# Patient Record
Sex: Female | Born: 2000 | Race: Asian | Hispanic: No | Marital: Single | Smoking: Never smoker
Health system: Southern US, Community
[De-identification: ages and names within clinical notes are randomized; demographics above are authoritative.]

## PROBLEM LIST (undated history)

## (undated) ENCOUNTER — Inpatient Hospital Stay (HOSPITAL_COMMUNITY): Payer: Self-pay

## (undated) DIAGNOSIS — O24419 Gestational diabetes mellitus in pregnancy, unspecified control: Secondary | ICD-10-CM

## (undated) DIAGNOSIS — I10 Essential (primary) hypertension: Secondary | ICD-10-CM

## (undated) DIAGNOSIS — Z8619 Personal history of other infectious and parasitic diseases: Secondary | ICD-10-CM

## (undated) DIAGNOSIS — R030 Elevated blood-pressure reading, without diagnosis of hypertension: Secondary | ICD-10-CM

## (undated) DIAGNOSIS — O139 Gestational [pregnancy-induced] hypertension without significant proteinuria, unspecified trimester: Secondary | ICD-10-CM

## (undated) HISTORY — DX: Personal history of other infectious and parasitic diseases: Z86.19

## (undated) HISTORY — DX: Gestational (pregnancy-induced) hypertension without significant proteinuria, unspecified trimester: O13.9

## (undated) HISTORY — PX: NO PAST SURGERIES: SHX2092

## (undated) HISTORY — DX: Elevated blood-pressure reading, without diagnosis of hypertension: R03.0

## (undated) HISTORY — DX: Essential (primary) hypertension: I10

---
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
## 2020-03-09 ENCOUNTER — Ambulatory Visit (INDEPENDENT_AMBULATORY_CARE_PROVIDER_SITE_OTHER): Payer: Medicaid Other | Admitting: Primary Care

## 2020-07-01 ENCOUNTER — Encounter (INDEPENDENT_AMBULATORY_CARE_PROVIDER_SITE_OTHER): Payer: Self-pay | Admitting: Primary Care

## 2020-07-01 DIAGNOSIS — B379 Candidiasis, unspecified: Secondary | ICD-10-CM

## 2020-07-02 ENCOUNTER — Telehealth (INDEPENDENT_AMBULATORY_CARE_PROVIDER_SITE_OTHER): Payer: Self-pay

## 2020-07-02 NOTE — Telephone Encounter (Signed)
Copied from CRM [id number]. Topic: General - Other >> Jul 02, 2020  9:19 AM Marylen Ponto wrote: Reason for CRM: Pt requests call back from [facility]. Pt did not want to provide any details other than its about a yeast infection. Pt declined offer to schedule appt.

## 2020-07-07 NOTE — Telephone Encounter (Signed)
Called # left message regarding  having a question- no information given

## 2020-07-10 MED ORDER — MONISTAT 1 COMBO PACK 1200 & 2 MG & % VA KIT: 1.0000 | PACK | Freq: Once | VAGINAL | 0 refills | Status: AC

## 2020-10-20 ENCOUNTER — Telehealth: Payer: Self-pay | Admitting: Obstetrics and Gynecology

## 2020-10-20 ENCOUNTER — Ambulatory Visit (INDEPENDENT_AMBULATORY_CARE_PROVIDER_SITE_OTHER): Payer: Medicaid Other | Admitting: Primary Care

## 2020-10-20 ENCOUNTER — Ambulatory Visit (INDEPENDENT_AMBULATORY_CARE_PROVIDER_SITE_OTHER): Payer: Medicaid Other

## 2020-10-20 ENCOUNTER — Encounter (INDEPENDENT_AMBULATORY_CARE_PROVIDER_SITE_OTHER): Payer: Self-pay | Admitting: Primary Care

## 2020-10-20 ENCOUNTER — Other Ambulatory Visit: Payer: Self-pay

## 2020-10-20 VITALS — BP 140/95 | HR 80 | Temp 97.7°F | Ht 62.0 in | Wt 182.0 lb

## 2020-10-20 DIAGNOSIS — Z3480 Encounter for supervision of other normal pregnancy, unspecified trimester: Secondary | ICD-10-CM

## 2020-10-20 DIAGNOSIS — N912 Amenorrhea, unspecified: Secondary | ICD-10-CM | POA: Diagnosis not present

## 2020-10-20 DIAGNOSIS — O0990 Supervision of high risk pregnancy, unspecified, unspecified trimester: Secondary | ICD-10-CM | POA: Insufficient documentation

## 2020-10-20 DIAGNOSIS — I10 Essential (primary) hypertension: Secondary | ICD-10-CM | POA: Diagnosis not present

## 2020-10-20 DIAGNOSIS — Z3201 Encounter for pregnancy test, result positive: Secondary | ICD-10-CM | POA: Diagnosis not present

## 2020-10-20 DIAGNOSIS — Z0001 Encounter for general adult medical examination with abnormal findings: Secondary | ICD-10-CM

## 2020-10-20 LAB — POCT URINE PREGNANCY: Preg Test, Ur: POSITIVE — AB

## 2020-10-20 MED ORDER — NIFEDIPINE ER OSMOTIC RELEASE 30 MG PO TB24: 30.0000 mg | ORAL_TABLET | Freq: Every day | ORAL | 1 refills | Status: DC

## 2020-10-20 NOTE — Telephone Encounter (Signed)
Spoke with patient about going back to [facility] because she has a high risk pregnancy. She said she would call the office if she hasn't heard from them by Friday.

## 2020-10-20 NOTE — Patient Instructions (Signed)
    Please remember that if you are not able to keep your appointment to call the office and reschedule, or cancel. If you no-show or cancel with less than 24 hour notice we will charge you, not your insurance a $50 fee.  

## 2020-10-20 NOTE — Progress Notes (Signed)
    I discussed the limitations, risks, security and privacy concerns of performing an evaluation and management service by telephone and the availability of in person appointments. I also discussed with the patient that there may be a patient responsible charge related to this service. The patient expressed understanding and agreed to proceed.   Location: [facility] Patient: clinic Provider: clinic Interpreter used: no professional interpreter  PRENATAL INTAKE SUMMARY  Ms. Regina [name] presents today New OB Nurse Interview.  OB History     Gravida  3   Para  2   Term  1   Preterm  1   AB      Living  2      SAB      IAB      Ectopic      Multiple  0   Live Births  2          I have reviewed the patient's medical, obstetrical, social, and family histories, medications, and available lab results.  SUBJECTIVE She has no unusual complaints  OBJECTIVE Initial Nurse interview for history/labs (New OB)  Vitals:   10/20/20 1107  BP: (!) 140/95  Pulse: 80  Temp: 97.7 F (36.5 C)  Weight: 182 lb (82.6 kg)  BMI (Calculated): 33.28     last menstrual period date: 09/10/2020 EDD: 06/17/2021  GA: [redacted]w[redacted]d G3P1102 FHT: unable to obtain due to GA  GENERAL APPEARANCE: alert, well appearing, in no apparent distress, oriented to person, place and time   ASSESSMENT Normal pregnancy  PLAN Prenatal care:  [facility].  Will obtain Labs next apt.  No pap due to age    Blood Pressure Monitor/Weight Scale In person Visits   MyChart/Babyscripts In person visit  Placed OB Box on problem list and updated   Followup with Raelyn Mora, CNM  12/03/2020  Follow Up Instructions:   I discussed the assessment and treatment plan with the patient. The patient was provided an opportunity to ask questions and all were answered. The patient agreed with the plan and demonstrated an understanding of the instructions.   The patient was advised to call back or seek an  in-person evaluation if the symptoms worsen or if the condition fails to improve as anticipated.  I provided 40 minutes of  face-to-face time during this encounter.  Gearldine Shown, CMA
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
## 2020-10-20 NOTE — Patient Instructions (Signed)
Hypertension During Pregnancy Hypertension is also called high blood pressure. High blood pressure means that the force of the blood moving in your body is high enough to cause problems for you and your baby. Different types of high blood pressure can happen during pregnancy. The types are: High blood pressure before you got pregnant. This is called chronic hypertension.  This can continue during your pregnancy. Your doctor will want to keep checking your blood pressure. You may need medicine to control your blood pressure while you are pregnant. You will need follow-up visits after you have your baby. High blood pressure that goes up during pregnancy when it was normal before. This is called gestational hypertension. It will often get better after you have your baby, but your doctor will need to watch your blood pressure to make sure that it is getting better. You may develop high blood pressure after giving birth. This is called postpartum hypertension. This often occurs within 48 hours after childbirth but may occur up to 6 weeks after giving birth. Very high blood pressure during pregnancy is an emergency that needs treatment right away. How does this affect me? If you have high blood pressure during pregnancy, you have a higher chance of developing high blood pressure: As you get older. If you get pregnant again. In some cases, high blood pressure during pregnancy can cause: Stroke. Heart attack. Damage to the kidneys, lungs, or liver. Preeclampsia. HELLP syndrome. Seizures. Problems with the placenta. How does this affect my baby? Your baby may: Be born early. Not weigh as much as he or she should. Not handle labor well, leading to a C-section. This condition may also result in a baby's death before birth (stillbirth). What are the risks? Having high blood pressure during a past pregnancy. Being overweight. Being age 35 or older. Being pregnant for the first time. Being pregnant  with more than one baby. Becoming pregnant using fertility methods, such as IVF. Having other problems, such as diabetes or kidney disease. What can I do to lower my risk?  Keep a healthy weight. Eat a healthy diet. Follow what your doctor tells you about treating any medical problems that you had before you got pregnant. It is very important to go to all of your doctor visits. Your doctor will check your blood pressure and make sure that your pregnancy is progressing as it should. Treatment should start early if a problem is found. How is this treated? Treatment for high blood pressure during pregnancy can vary. It depends on the type of high blood pressure you have and how serious it is. If you were taking medicine for your blood pressure before you got pregnant, talk with your doctor. You may need to change the medicine during pregnancy if it is not safe for your baby. If your blood pressure goes up during pregnancy, your doctor may order medicine to treat this. If you are at risk for preeclampsia, your doctor may tell you to take a low-dose aspirin while you are pregnant. If you have very high blood pressure, you may need to stay in the hospital so you and your baby can be watched closely. You may also need to take medicine to lower your blood pressure. In some cases, if your condition gets worse, you may need to have your baby early. Follow these instructions at home: Eating and drinking  Drink enough fluid to keep your pee (urine) pale yellow. Avoid caffeine. Lifestyle Do not smoke or use any products that contain   nicotine or tobacco. If you need help quitting, ask your doctor. Do not use alcohol or drugs. Avoid stress. Rest and get plenty of sleep. Regular exercise can help. Ask your doctor what kinds of exercise are best for you. General instructions Take over-the-counter and prescription medicines only as told by your doctor. Keep all prenatal and follow-up visits. Contact a  doctor if: You have symptoms that your doctor told you to watch for, such as: Headaches. A feeling like you may vomit (nausea). Vomiting. Belly (abdominal) pain. Feeling dizzy or light-headed. Get help right away if: You have symptoms of serious problems, such as: Very bad belly pain that does not get better with treatment. A very bad headache that does not get better. Blurry vision. Double vision. Vomiting that does not get better. Sudden, fast weight gain. Sudden swelling in your hands, ankles, or face. Bleeding from your vagina. Blood in your pee. Shortness of breath. Chest pain. Weakness on one side of your body. Trouble talking. Your baby is not moving as much as usual. These symptoms may be an emergency. Get help right away. Call your local emergency services (911 in the [place] Do not wait to see if the symptoms will go away. Do not drive yourself to the hospital. Summary High blood pressure is also called hypertension. High blood pressure means that the force of the blood moving in your body is high enough to cause problems for you and your baby. Get help right away if you have symptoms of serious problems due to high blood pressure. Keep all prenatal and follow-up visits. This information is not intended to replace advice given to you by your health care provider. Make sure you discuss any questions you have with your health care provider. Document Revised: 09/12/2019 Document Reviewed: 09/12/2019 Elsevier Patient Education  2022 [employer].

## 2020-11-24 ENCOUNTER — Other Ambulatory Visit: Payer: Self-pay

## 2020-11-24 ENCOUNTER — Encounter: Payer: Self-pay | Admitting: Obstetrics and Gynecology

## 2020-11-24 ENCOUNTER — Other Ambulatory Visit (HOSPITAL_COMMUNITY)
Admission: RE | Admit: 2020-11-24 | Discharge: 2020-11-24 | Disposition: A | Discharge status: Home / Self Care | Payer: Medicaid Other | Source: Ambulatory Visit | Attending: Obstetrics and Gynecology | Admitting: Obstetrics and Gynecology

## 2020-11-24 ENCOUNTER — Ambulatory Visit (INDEPENDENT_AMBULATORY_CARE_PROVIDER_SITE_OTHER): Payer: Medicaid Other | Admitting: Obstetrics and Gynecology

## 2020-11-24 ENCOUNTER — Encounter: Payer: Self-pay | Admitting: General Practice

## 2020-11-24 VITALS — BP 136/88 | HR 83 | Wt 175.0 lb

## 2020-11-24 DIAGNOSIS — O0990 Supervision of high risk pregnancy, unspecified, unspecified trimester: Secondary | ICD-10-CM | POA: Insufficient documentation

## 2020-11-24 DIAGNOSIS — I10 Essential (primary) hypertension: Secondary | ICD-10-CM

## 2020-11-24 DIAGNOSIS — Z23 Encounter for immunization: Secondary | ICD-10-CM | POA: Diagnosis not present

## 2020-11-24 LAB — POCT URINALYSIS DIP (DEVICE)
Bilirubin Urine: NEGATIVE
Glucose, UA: NEGATIVE mg/dL
Hgb urine dipstick: NEGATIVE
Ketones, ur: NEGATIVE mg/dL
Leukocytes,Ua: NEGATIVE
Nitrite: NEGATIVE
Protein, ur: NEGATIVE mg/dL
Specific Gravity, Urine: 1.015 (ref 1.005–1.030)
Urobilinogen, UA: 0.2 mg/dL (ref 0.0–1.0)
pH: 7 (ref 5.0–8.0)

## 2020-11-24 MED ORDER — PREPLUS 27-1 MG PO TABS: 1.0000 | ORAL_TABLET | Freq: Every day | ORAL | 11 refills | Status: DC

## 2020-11-24 MED ORDER — BLOOD PRESSURE KIT: 1.0000 | PACK | Freq: Once | 0 refills | Status: AC

## 2020-11-24 MED ORDER — ASPIRIN EC 81 MG PO TBEC: 81.0000 mg | DELAYED_RELEASE_TABLET | Freq: Every day | ORAL | 11 refills | Status: DC

## 2020-11-24 MED ORDER — GOJJI WEIGHT SCALE MISC
1.0000 | Freq: Once | 0 refills | Status: AC
Start: 2020-11-24 — End: 2020-11-24

## 2020-11-24 NOTE — Progress Notes (Signed)
Subjective:    Regina [name] is a [id number] [redacted]w[redacted]d being seen today for her first obstetrical visit.  Her obstetrical history is significant for history of pre-eclampsia, CHTN, short interval between pregnancy with last delivey 09/2019. Patient does intend to breast feed. Pregnancy history fully reviewed.  Patient reports no complaints.  Vitals:   11/24/20 0958  BP: 136/88  Pulse: 83  Weight: 175 lb (79.4 kg)    HISTORY: OB History  Gravida Para Term Preterm AB Living  3 2 1 1  0 2  SAB IAB Ectopic Multiple Live Births  0 0 0 0 2    # Outcome Date GA Lbr Len/2nd Weight Sex Delivery Anes PTL Lv  3 Current           2 Term 09/20/19 [redacted]w[redacted]d  5 lb 13 oz (2.635 kg) F Vag-Spont   LIV  1 Preterm 12/27/17 [redacted]w[redacted]d 09:00 / 00:25 5 lb 7.5 oz (2.481 kg) F Vag-Spont EPI  LIV   Past Medical History:  Diagnosis Date  . Chronic hypertension during pregnancy 04/02/2019   [x]  Aspirin 81 mg daily after 12 weeks Current antihypertensives:  Labetalol   Baseline and surveillance labs (pulled in from [facility], refresh links as needed)  Lab Results Component Value Date  PLT 346 02/25/2019  CREATININE 0.54 (L) 02/25/2019  AST 11 02/25/2019  ALT 8 02/25/2019  PROTCRRATIO 0.09 12/26/2017   Antenatal Testing CHTN - O10.919  Group I   BP < 140/90, no meds, no preeclampsia, AGA, n  . GDM (gestational diabetes mellitus) 07/19/2019  . Gestational diabetes   . History of preterm delivery 08/02/2019   G1 at [redacted]w[redacted]d due to preE  . History of severe preeclampsia, prior pregnancy, currently pregnant 04/02/2019  . 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 Hypertension   . Pregnancy induced hypertension   . Supervision of high risk pregnancy, antepartum 02/25/2019    Nursing Staff Provider Office Location [facility] Dating  LMP  Language  English Anatomy 08/2015   Flu Vaccine  Declined 04/02/2019 Genetic Screen  NIPS: Low risk, female  AFP: Screen Negative  TDaP vaccine  07/18/19 Hgb A1C or  GTT Early 5.4 Third trimester  Rhogam  n/a   LAB  RESULTS  Feeding Plan Undecided Blood Type O/Positive/-- (02/22 1539)  Contraception Pills? Antibody Negative (02/22 1539) Circumci   Past Surgical History:  Procedure Laterality Date  . NO PAST SURGERIES     Family History  Problem Relation Age of Onset  . Hypertension Mother      Exam    Uterus:   10-weeks  Pelvic Exam:    Perineum: Normal Perineum   Vulva: normal   Vagina:  normal mucosa, normal discharge   pH:    Cervix: multiparous appearance and closed and long   Adnexa: no mass, fullness, tenderness   Bony Pelvis: gynecoid  System: Breast:  normal appearance, no masses or tenderness   Skin: normal coloration and turgor, no rashes    Neurologic: oriented, no focal deficits   Extremities: normal strength, tone, and muscle mass   HEENT extra ocular movement intact   Mouth/Teeth mucous membranes moist, pharynx normal without lesions and dental hygiene good   Neck supple and no masses   Cardiovascular: regular rate and rhythm   Respiratory:  appears well, vitals normal, no respiratory distress, acyanotic, normal RR, chest clear, no wheezing, crepitations, rhonchi, normal symmetric air entry   Abdomen: soft, non-tender; bowel sounds normal; no masses,  no organomegaly   Urinary:  Assessment:    Pregnancy: [id number] Patient Active Problem List   Diagnosis Date Noted  . Supervision of high risk pregnancy, antepartum 10/20/2020  . Chronic hypertension 09/19/2019  . History of severe preeclampsia, prior pregnancy, currently pregnant 04/02/2019  . Hx of [place] spotted fever 07/04/2015        Plan:     Initial labs drawn. Prenatal vitamins. Problem list reviewed and updated. Genetic Screening discussed : Panorama ordered.  Ultrasound discussed; fetal survey: ordered. Rx ASA provided to start at 12 weeks  Follow up in 4 weeks. 50% of 30 min visit spent on counseling and coordination of care.     Marelyn Rouser 11/24/2020

## 2020-11-24 NOTE — Patient Instructions (Signed)
First Trimester of Pregnancy The first trimester of pregnancy starts on the first day of your last menstrual period until the end of week 12. This is months 1 through 3 of pregnancy. A week after a sperm fertilizes an egg, the egg will implant into the wall of the uterus and begin to develop into a baby. By the end of 12 weeks, all the baby's organs will be formed and the baby will be 2-3 inches in size. Body changes during your first trimester Your body goes through many changes during pregnancy. The changes vary and generally return to normal after your baby is born. Physical changes You may gain or lose weight. Your breasts may begin to grow larger and become tender. The tissue that surrounds your nipples (areola) may become darker. Dark spots or blotches (chloasma or mask of pregnancy) may develop on your face. You may have changes in your hair. These can include thickening or thinning of your hair or changes in texture. Health changes You may feel nauseous, and you may vomit. You may have heartburn. You may develop headaches. You may develop constipation. Your gums may bleed and may be sensitive to brushing and flossing. Other changes You may tire easily. You may urinate more often. Your menstrual periods will stop. You may have a loss of appetite. You may develop cravings for certain kinds of food. You may have changes in your emotions from day to day. You may have more vivid and strange dreams. Follow these instructions at home: Medicines Follow your health care provider's instructions regarding medicine use. Specific medicines may be either safe or unsafe to take during pregnancy. Do not take any medicines unless told to by your health care provider. Take a prenatal vitamin that contains at least 600 micrograms (mcg) of folic acid. Eating and drinking Eat a healthy diet that includes fresh fruits and vegetables, whole grains, good sources of protein such as meat, eggs, or tofu,  and low-fat dairy products. Avoid raw meat and unpasteurized juice, milk, and cheese. These carry germs that can harm you and your baby. If you feel nauseous or you vomit: Eat 4 or 5 small meals a day instead of 3 large meals. Try eating a few soda crackers. Drink liquids between meals instead of during meals. You may need to take these actions to prevent or treat constipation: Drink enough fluid to keep your urine pale yellow. Eat foods that are high in fiber, such as beans, whole grains, and fresh fruits and vegetables. Limit foods that are high in fat and processed sugars, such as fried or sweet foods. Activity Exercise only as directed by your health care provider. Most people can continue their usual exercise routine during pregnancy. Try to exercise for 30 minutes at least 5 days a week. Stop exercising if you develop pain or cramping in the lower abdomen or lower back. Avoid exercising if it is very hot or humid or if you are at high altitude. Avoid heavy lifting. If you choose to, you may have sex unless your health care provider tells you not to. Relieving pain and discomfort Wear a good support bra to relieve breast tenderness. Rest with your legs elevated if you have leg cramps or low back pain. If you develop bulging veins (varicose veins) in your legs: Wear support hose as told by your health care provider. Elevate your feet for 15 minutes, 3-4 times a day. Limit salt in your diet. Safety Wear your seat belt at all times when driving  or riding in a car. Talk with your health care provider if someone is verbally or physically abusive to you. Talk with your health care provider if you are feeling sad or have thoughts of hurting yourself. Lifestyle Do not use hot tubs, steam rooms, or saunas. Do not douche. Do not use tampons or scented sanitary pads. Do not use herbal remedies, alcohol, illegal drugs, or medicines that are not approved by your health care provider. Chemicals  in these products can harm your baby. Do not use any products that contain nicotine or tobacco, such as cigarettes, e-cigarettes, and chewing tobacco. If you need help quitting, ask your health care provider. Avoid cat litter boxes and soil used by cats. These carry germs that can cause birth defects in the baby and possibly loss of the unborn baby (fetus) by miscarriage or stillbirth. General instructions During routine prenatal visits in the first trimester, your health care provider will do a physical exam, perform necessary tests, and ask you how things are going. Keep all follow-up visits. This is important. Ask for help if you have counseling or nutritional needs during pregnancy. Your health care provider can offer advice or refer you to specialists for help with various needs. Schedule a dentist appointment. At home, brush your teeth with a soft toothbrush. Floss gently. Write down your questions. Take them to your prenatal visits. Where to find more information American Pregnancy Association: americanpregnancy.org [employer]: https://www.todd-brady.net/ Office on [employer] Health: MightyReward.co.nz Contact a health care provider if you have: Dizziness. A fever. Mild pelvic cramps, pelvic pressure, or nagging pain in the abdominal area. Nausea, vomiting, or diarrhea that lasts for 24 hours or longer. A bad-smelling vaginal discharge. Pain when you urinate. Known exposure to a contagious illness, such as chickenpox, measles, Zika virus, HIV, or hepatitis. Get help right away if you have: Spotting or bleeding from your vagina. Severe abdominal cramping or pain. Shortness of breath or chest pain. Any kind of trauma, such as from a fall or a car crash. New or increased pain, swelling, or redness in an arm or leg. Summary The first trimester of pregnancy starts on the first day of your last menstrual period until the end of week  12 (months 1 through 3). Eating 4 or 5 small meals a day rather than 3 large meals may help to relieve nausea and vomiting. Do not use any products that contain nicotine or tobacco, such as cigarettes, e-cigarettes, and chewing tobacco. If you need help quitting, ask your health care provider. Keep all follow-up visits. This is important. This information is not intended to replace advice given to you by your health care provider. Make sure you discuss any questions you have with your health care provider. Document Revised: 05/29/2019 Document Reviewed: 04/04/2019 Elsevier Patient Education  2022 [employer].  Contraception Choices Contraception, also called birth control, refers to methods or devices that prevent pregnancy. Hormonal methods Contraceptive implant A contraceptive implant is a thin, plastic tube that contains a hormone that prevents pregnancy. It is different from an intrauterine device (IUD). It is inserted into the upper part of the arm by a health care provider. Implants can be effective for up to 3 years. Progestin-only injections Progestin-only injections are injections of progestin, a synthetic form of the hormone progesterone. They are given every 3 months by a health care provider. Birth control pills Birth control pills are pills that contain hormones that prevent pregnancy. They must be taken once a day, preferably  at the same time each day. A prescription is needed to use this method of contraception. Birth control patch The birth control patch contains hormones that prevent pregnancy. It is placed on the skin and must be changed once a week for three weeks and removed on the fourth week. A prescription is needed to use this method of contraception. Vaginal ring A vaginal ring contains hormones that prevent pregnancy. It is placed in the vagina for three weeks and removed on the fourth week. After that, the process is repeated with a new ring. A prescription is needed  to use this method of contraception. Emergency contraceptive Emergency contraceptives prevent pregnancy after unprotected sex. They come in pill form and can be taken up to 5 days after sex. They work best the sooner they are taken after having sex. Most emergency contraceptives are available without a prescription. This method should not be used as your only form of birth control. Barrier methods Female condom A female condom is a thin sheath that is worn over the penis during sex. Condoms keep sperm from going inside a woman's body. They can be used with a sperm-killing substance (spermicide) to increase their effectiveness. They should be thrown away after one use. Female condom A female condom is a soft, loose-fitting sheath that is put into the vagina before sex. The condom keeps sperm from going inside a woman's body. They should be thrown away after one use. Diaphragm A diaphragm is a soft, dome-shaped barrier. It is inserted into the vagina before sex, along with a spermicide. The diaphragm blocks sperm from entering the uterus, and the spermicide kills sperm. A diaphragm should be left in the vagina for 6-8 hours after sex and removed within 24 hours. A diaphragm is prescribed and fitted by a health care provider. A diaphragm should be replaced every 1-2 years, after giving birth, after gaining more than 15 lb (6.8 kg), and after pelvic surgery. Cervical cap A cervical cap is a round, soft latex or plastic cup that fits over the cervix. It is inserted into the vagina before sex, along with spermicide. It blocks sperm from entering the uterus. The cap should be left in place for 6-8 hours after sex and removed within 48 hours. A cervical cap must be prescribed and fitted by a health care provider. It should be replaced every 2 years. Sponge A sponge is a soft, circular piece of polyurethane foam with spermicide in it. The sponge helps block sperm from entering the uterus, and the spermicide kills  sperm. To use it, you make it wet and then insert it into the vagina. It should be inserted before sex, left in for at least 6 hours after sex, and removed and thrown away within 30 hours. Spermicides Spermicides are chemicals that kill or block sperm from entering the cervix and uterus. They can come as a cream, jelly, suppository, foam, or tablet. A spermicide should be inserted into the vagina with an applicator at least 10-15 minutes before sex to allow time for it to work. The process must be repeated every time you have sex. Spermicides do not require a prescription. Intrauterine contraception Intrauterine device (IUD) An IUD is a T-shaped device that is put in a woman's uterus. There are two types: Hormone IUD.This type contains progestin, a synthetic form of the hormone progesterone. This type can stay in place for 3-5 years. Copper IUD.This type is wrapped in copper wire. It can stay in place for 10 years. Permanent methods of  contraception Female tubal ligation In this method, a woman's fallopian tubes are sealed, tied, or blocked during surgery to prevent eggs from traveling to the uterus. Hysteroscopic sterilization In this method, a small, flexible insert is placed into each fallopian tube. The inserts cause scar tissue to form in the fallopian tubes and block them, so sperm cannot reach an egg. The procedure takes about 3 months to be effective. Another form of birth control must be used during those 3 months. Female sterilization This is a procedure to tie off the tubes that carry sperm (vasectomy). After the procedure, the man can still ejaculate fluid (semen). Another form of birth control must be used for 3 months after the procedure. Natural planning methods Natural family planning In this method, a couple does not have sex on days when the woman could become pregnant. Calendar method In this method, the woman keeps track of the length of each menstrual cycle, identifies the days  when pregnancy can happen, and does not have sex on those days. Ovulation method In this method, a couple avoids sex during ovulation. Symptothermal method This method involves not having sex during ovulation. The woman typically checks for ovulation by watching changes in her temperature and in the consistency of cervical mucus. Post-ovulation method In this method, a couple waits to have sex until after ovulation. Where to find more information Centers for Disease Control and Prevention: FootballExhibition.com.br Summary Contraception, also called birth control, refers to methods or devices that prevent pregnancy. Hormonal methods of contraception include implants, injections, pills, patches, vaginal rings, and emergency contraceptives. Barrier methods of contraception can include female condoms, female condoms, diaphragms, cervical caps, sponges, and spermicides. There are two types of IUDs (intrauterine devices). An IUD can be put in a woman's uterus to prevent pregnancy for 3-5 years. Permanent sterilization can be done through a procedure for males and females. Natural family planning methods involve nothaving sex on days when the woman could become pregnant. This information is not intended to replace advice given to you by your health care provider. Make sure you discuss any questions you have with your health care provider. Document Revised: 05/27/2019 Document Reviewed: 05/27/2019 Elsevier Patient Education  2022 [employer].

## 2020-11-24 NOTE — Addendum Note (Signed)
Addended by: Kathee Delton on: 11/24/2020 11:57 AM   Modules accepted: Orders

## 2020-11-25 LAB — PROTEIN / CREATININE RATIO, URINE
Creatinine, Urine: 53 mg/dL
Protein, Ur: 6 mg/dL
Protein/Creat Ratio: 113 mg/g creat (ref 0–200)

## 2020-11-25 LAB — CBC/D/PLT+RPR+RH+ABO+RUBIGG...
Antibody Screen: NEGATIVE
Basophils Absolute: 0 10*3/uL (ref 0.0–0.2)
Basos: 0 %
EOS (ABSOLUTE): 0 10*3/uL (ref 0.0–0.4)
Eos: 0 %
HCV Ab: 0.2 s/co ratio (ref 0.0–0.9)
HIV Screen 4th Generation wRfx: NONREACTIVE
Hematocrit: 41.5 % (ref 34.0–46.6)
Hemoglobin: 13.9 g/dL (ref 11.1–15.9)
Hepatitis B Surface Ag: NEGATIVE
Immature Grans (Abs): 0 10*3/uL (ref 0.0–0.1)
Immature Granulocytes: 0 %
Lymphocytes Absolute: 1.4 10*3/uL (ref 0.7–3.1)
Lymphs: 20 %
MCH: 29.4 pg (ref 26.6–33.0)
MCHC: 33.5 g/dL (ref 31.5–35.7)
MCV: 88 fL (ref 79–97)
Monocytes Absolute: 0.4 10*3/uL (ref 0.1–0.9)
Monocytes: 6 %
Neutrophils Absolute: 5 10*3/uL (ref 1.4–7.0)
Neutrophils: 74 %
Platelets: 296 10*3/uL (ref 150–450)
RBC: 4.72 x10E6/uL (ref 3.77–5.28)
RDW: 13.3 % (ref 11.7–15.4)
RPR Ser Ql: NONREACTIVE
Rh Factor: POSITIVE
Rubella Antibodies, IGG: 1.71 index (ref >0.99)
WBC: 6.9 10*3/uL (ref 3.4–10.8)

## 2020-11-25 LAB — GC/CHLAMYDIA PROBE AMP (~~LOC~~) NOT AT ARMC
Chlamydia: NEGATIVE
Comment: NEGATIVE
Comment: NORMAL
Neisseria Gonorrhea: NEGATIVE

## 2020-11-25 LAB — COMPREHENSIVE METABOLIC PANEL
ALT: 7 IU/L (ref 0–32)
AST: 11 IU/L (ref 0–40)
Albumin/Globulin Ratio: 1.8 (ref 1.2–2.2)
Albumin: 4.6 g/dL (ref 3.9–5.0)
Alkaline Phosphatase: 52 IU/L (ref 42–106)
BUN/Creatinine Ratio: 11 (ref 9–23)
BUN: 5 mg/dL — ABNORMAL LOW (ref 6–20)
Bilirubin Total: 0.3 mg/dL (ref 0.0–1.2)
CO2: 22 mmol/L (ref 20–29)
Calcium: 9.3 mg/dL (ref 8.7–10.2)
Chloride: 103 mmol/L (ref 96–106)
Creatinine, Ser: 0.45 mg/dL — ABNORMAL LOW (ref 0.57–1.00)
Globulin, Total: 2.5 g/dL (ref 1.5–4.5)
Glucose: 80 mg/dL (ref 70–99)
Potassium: 3.9 mmol/L (ref 3.5–5.2)
Sodium: 136 mmol/L (ref 134–144)
Total Protein: 7.1 g/dL (ref 6.0–8.5)
eGFR: 141 mL/min/{1.73_m2} (ref >59)

## 2020-11-25 LAB — TSH: TSH: 0.649 u[IU]/mL (ref 0.450–4.500)

## 2020-11-25 LAB — HCV INTERPRETATION

## 2020-11-25 LAB — HEMOGLOBIN A1C
Est. average glucose Bld gHb Est-mCnc: 103 mg/dL
Hgb A1c MFr Bld: 5.2 % (ref 4.8–5.6)

## 2020-11-26 LAB — URINE CULTURE, OB REFLEX

## 2020-11-26 LAB — CULTURE, OB URINE

## 2020-12-03 ENCOUNTER — Encounter: Payer: Medicaid Other | Admitting: Obstetrics and Gynecology

## 2020-12-09 ENCOUNTER — Encounter: Payer: Self-pay | Admitting: *Deleted

## 2020-12-22 ENCOUNTER — Ambulatory Visit (INDEPENDENT_AMBULATORY_CARE_PROVIDER_SITE_OTHER): Payer: Medicaid Other | Admitting: Family Medicine

## 2020-12-22 ENCOUNTER — Encounter: Payer: Medicaid Other | Admitting: Family Medicine

## 2020-12-22 ENCOUNTER — Encounter: Payer: Self-pay | Admitting: Family Medicine

## 2020-12-22 ENCOUNTER — Other Ambulatory Visit: Payer: Self-pay

## 2020-12-22 VITALS — BP 127/83 | HR 80 | Wt 176.0 lb

## 2020-12-22 DIAGNOSIS — I10 Essential (primary) hypertension: Secondary | ICD-10-CM

## 2020-12-22 DIAGNOSIS — O09299 Supervision of pregnancy with other poor reproductive or obstetric history, unspecified trimester: Secondary | ICD-10-CM

## 2020-12-22 DIAGNOSIS — O0990 Supervision of high risk pregnancy, unspecified, unspecified trimester: Secondary | ICD-10-CM

## 2020-12-22 NOTE — Progress Notes (Signed)
° °  Subjective:  Regina [name] is a 19 y.o. [phone] at [redacted]w[redacted]d being seen today for ongoing prenatal care.  She is currently monitored for the following issues for this high-risk pregnancy and has Hx of [facility] spotted fever; History of severe preeclampsia, prior pregnancy, currently pregnant; Chronic hypertension; and Supervision of high risk pregnancy, antepartum on their problem list.  Patient reports no complaints.  Contractions: Irritability. Vag. Bleeding: None.  Movement: Absent. Denies leaking of fluid.   The following portions of the patient's history were reviewed and updated as appropriate: allergies, current medications, past family history, past medical history, past social history, past surgical history and problem list. Problem list updated.  Objective:   Vitals:   12/22/20 0902  BP: 127/83  Pulse: 80  Weight: 176 lb (79.8 kg)    Fetal Status: Fetal Heart Rate (bpm): 155   Movement: Absent     General:  Alert, oriented and cooperative. Patient is in no acute distress.  Skin: Skin is warm and dry. No rash noted.   Cardiovascular: Normal heart rate noted  Respiratory: Normal respiratory effort, no problems with respiration noted  Abdomen: Soft, gravid, appropriate for gestational age. Pain/Pressure: Present     Pelvic: Vag. Bleeding: None     Cervical exam deferred        Extremities: Normal range of motion.  Edema: None  Mental Status: Normal mood and affect. Normal behavior. Normal judgment and thought content.   Urinalysis:      Assessment and Plan:  Pregnancy: G3P1102 at [redacted]w[redacted]d  1. Supervision of high risk pregnancy, antepartum BP and FHR normal Too early for AFP, obtain next visit  2. Chronic hypertension BP normotensive Taking ASA  3. History of severe preeclampsia, prior pregnancy, currently pregnant   Preterm labor symptoms and general obstetric precautions including but not limited to vaginal bleeding, contractions, leaking of fluid and fetal  movement were reviewed in detail with the patient. Please refer to After Visit Summary for other counseling recommendations.  Return in 4 weeks (on 01/19/2021) for [facility], ob visit.   Venora Maples, MD

## 2020-12-22 NOTE — Patient Instructions (Signed)

## 2020-12-30 ENCOUNTER — Encounter: Payer: Self-pay | Admitting: *Deleted

## 2021-01-14 ENCOUNTER — Other Ambulatory Visit: Payer: Self-pay

## 2021-01-14 ENCOUNTER — Ambulatory Visit (INDEPENDENT_AMBULATORY_CARE_PROVIDER_SITE_OTHER): Payer: Medicaid Other | Admitting: Family Medicine

## 2021-01-14 VITALS — BP 138/85 | HR 85 | Wt 177.0 lb

## 2021-01-14 DIAGNOSIS — O0990 Supervision of high risk pregnancy, unspecified, unspecified trimester: Secondary | ICD-10-CM

## 2021-01-14 DIAGNOSIS — I10 Essential (primary) hypertension: Secondary | ICD-10-CM

## 2021-01-14 DIAGNOSIS — O09299 Supervision of pregnancy with other poor reproductive or obstetric history, unspecified trimester: Secondary | ICD-10-CM

## 2021-01-14 NOTE — Progress Notes (Signed)
Patient in for routine prenatal visit. States she has been having "shooting sharp" pain in her upper abdomin and at times lower abdomin. Has been having headaches with "yellow spots". Reports this has been happening for the last 2 weeks. Has not felt fetal movement at this time.  No other concerns.  Altha Harm, CMA

## 2021-01-14 NOTE — Patient Instructions (Signed)

## 2021-01-14 NOTE — Progress Notes (Signed)
° ° °  PRENATAL VISIT NOTE  Subjective:  Regina [name] is a 21 y.o. [phone] at [redacted]w[redacted]d being seen today for ongoing prenatal care.  She is currently monitored for the following issues for this high-risk pregnancy and has Hx of [facility] spotted fever; History of severe preeclampsia, prior pregnancy, currently pregnant; Chronic hypertension; and Supervision of high risk pregnancy, antepartum on their problem list.  Patient reports no complaints.  Contractions: Not present. Vag. Bleeding: Small.  Movement: Absent. Denies leaking of fluid.   The following portions of the patient's history were reviewed and updated as appropriate: allergies, current medications, past family history, past medical history, past social history, past surgical history and problem list.   Objective:   Vitals:   01/14/21 0821  BP: 138/85  Pulse: 85  Weight: 177 lb (80.3 kg)    Fetal Status: Fetal Heart Rate (bpm): 153   Movement: Absent     General:  Alert, oriented and cooperative. Patient is in no acute distress.  Skin: Skin is warm and dry. No rash noted.   Cardiovascular: Normal heart rate noted  Respiratory: Normal respiratory effort, no problems with respiration noted  Abdomen: Soft, gravid, appropriate for gestational age.  Pain/Pressure: Present     Pelvic: Cervical exam deferred        Extremities: Normal range of motion.  Edema: None  Mental Status: Normal mood and affect. Normal behavior. Normal judgment and thought content.   Assessment and Plan:  Pregnancy: G3P1102 at [redacted]w[redacted]d 1. Chronic hypertension BP is WNL on no meds Normal Baseline labs On ASA  2. History of severe preeclampsia, prior pregnancy, currently pregnant On ASA  3. Supervision of high risk pregnancy, antepartum AFP today Anatomy u/s is scheduled - AFP, Serum, Open Spina Bifida  General obstetric precautions including but not limited to vaginal bleeding, contractions, leaking of fluid and fetal movement were reviewed in  detail with the patient. Please refer to After Visit Summary for other counseling recommendations.   Return in 4 weeks (on 02/11/2021).  Future Appointments  Date Time Provider [place]  01/19/2021  8:15 AM [facility] NURSE [facility]  01/19/2021  8:30 AM [facility] US3 [facility]  02/11/2021 10:55 AM Chancy Milroy, MD [facility]    Donnamae Jude, MD

## 2021-01-16 LAB — AFP, SERUM, OPEN SPINA BIFIDA
AFP MoM: 1.21
AFP Value: 49.7 ng/mL
Gest. Age on Collection Date: 18 weeks
Maternal Age At EDD: 21.3 yr
OSBR Risk 1 IN: 6301
Test Results:: NEGATIVE
Weight: 177 [lb_av]

## 2021-01-19 ENCOUNTER — Ambulatory Visit: Payer: Medicaid Other | Attending: Obstetrics and Gynecology

## 2021-01-19 ENCOUNTER — Encounter: Payer: Self-pay | Admitting: *Deleted

## 2021-01-19 ENCOUNTER — Other Ambulatory Visit: Payer: Self-pay | Admitting: *Deleted

## 2021-01-19 ENCOUNTER — Other Ambulatory Visit: Payer: Self-pay

## 2021-01-19 ENCOUNTER — Ambulatory Visit: Payer: Medicaid Other | Admitting: *Deleted

## 2021-01-19 VITALS — BP 120/71 | HR 88

## 2021-01-19 DIAGNOSIS — O0990 Supervision of high risk pregnancy, unspecified, unspecified trimester: Secondary | ICD-10-CM | POA: Insufficient documentation

## 2021-01-19 DIAGNOSIS — O10919 Unspecified pre-existing hypertension complicating pregnancy, unspecified trimester: Secondary | ICD-10-CM

## 2021-01-19 DIAGNOSIS — Z6832 Body mass index (BMI) 32.0-32.9, adult: Secondary | ICD-10-CM

## 2021-02-11 ENCOUNTER — Encounter: Payer: Self-pay | Admitting: Obstetrics & Gynecology

## 2021-02-11 ENCOUNTER — Encounter: Payer: Medicaid Other | Admitting: Obstetrics and Gynecology

## 2021-02-11 ENCOUNTER — Other Ambulatory Visit (HOSPITAL_COMMUNITY)
Admission: RE | Admit: 2021-02-11 | Discharge: 2021-02-11 | Disposition: A | Discharge status: Home / Self Care | Payer: Medicaid Other | Source: Ambulatory Visit | Attending: Obstetrics and Gynecology | Admitting: Obstetrics and Gynecology

## 2021-02-11 ENCOUNTER — Ambulatory Visit (INDEPENDENT_AMBULATORY_CARE_PROVIDER_SITE_OTHER): Payer: Medicaid Other | Admitting: Obstetrics & Gynecology

## 2021-02-11 ENCOUNTER — Encounter (HOSPITAL_COMMUNITY): Payer: Self-pay | Admitting: Family Medicine

## 2021-02-11 ENCOUNTER — Inpatient Hospital Stay (HOSPITAL_COMMUNITY)
Admission: AD | Admit: 2021-02-11 | Discharge: 2021-02-11 | Disposition: A | Discharge status: Home / Self Care | Payer: Medicaid Other | Attending: Family Medicine | Admitting: Family Medicine

## 2021-02-11 ENCOUNTER — Other Ambulatory Visit: Payer: Self-pay

## 2021-02-11 VITALS — BP 139/80 | HR 92 | Wt 182.0 lb

## 2021-02-11 DIAGNOSIS — Z3A22 22 weeks gestation of pregnancy: Secondary | ICD-10-CM | POA: Diagnosis not present

## 2021-02-11 DIAGNOSIS — O10012 Pre-existing essential hypertension complicating pregnancy, second trimester: Secondary | ICD-10-CM | POA: Diagnosis not present

## 2021-02-11 DIAGNOSIS — N760 Acute vaginitis: Secondary | ICD-10-CM

## 2021-02-11 DIAGNOSIS — O10919 Unspecified pre-existing hypertension complicating pregnancy, unspecified trimester: Secondary | ICD-10-CM | POA: Diagnosis not present

## 2021-02-11 DIAGNOSIS — N898 Other specified noninflammatory disorders of vagina: Secondary | ICD-10-CM | POA: Insufficient documentation

## 2021-02-11 DIAGNOSIS — O0990 Supervision of high risk pregnancy, unspecified, unspecified trimester: Secondary | ICD-10-CM

## 2021-02-11 DIAGNOSIS — O09299 Supervision of pregnancy with other poor reproductive or obstetric history, unspecified trimester: Secondary | ICD-10-CM

## 2021-02-11 DIAGNOSIS — Z7982 Long term (current) use of aspirin: Secondary | ICD-10-CM | POA: Insufficient documentation

## 2021-02-11 DIAGNOSIS — R030 Elevated blood-pressure reading, without diagnosis of hypertension: Secondary | ICD-10-CM | POA: Diagnosis present

## 2021-02-11 DIAGNOSIS — O26892 Other specified pregnancy related conditions, second trimester: Secondary | ICD-10-CM

## 2021-02-11 DIAGNOSIS — O23592 Infection of other part of genital tract in pregnancy, second trimester: Secondary | ICD-10-CM

## 2021-02-11 LAB — COMPREHENSIVE METABOLIC PANEL
ALT: 11 U/L (ref 0–44)
AST: 15 U/L (ref 15–41)
Albumin: 3.1 g/dL — ABNORMAL LOW (ref 3.5–5.0)
Alkaline Phosphatase: 42 U/L (ref 38–126)
Anion gap: 8 (ref 5–15)
BUN: 6 mg/dL (ref 6–20)
CO2: 21 mmol/L — ABNORMAL LOW (ref 22–32)
Calcium: 8.6 mg/dL — ABNORMAL LOW (ref 8.9–10.3)
Chloride: 106 mmol/L (ref 98–111)
Creatinine, Ser: 0.53 mg/dL (ref 0.44–1.00)
GFR, Estimated: >60 mL/min (ref >60)
Glucose, Bld: 131 mg/dL — ABNORMAL HIGH (ref 70–99)
Potassium: 3.7 mmol/L (ref 3.5–5.1)
Sodium: 135 mmol/L (ref 135–145)
Total Bilirubin: 0.3 mg/dL (ref 0.3–1.2)
Total Protein: 6.3 g/dL — ABNORMAL LOW (ref 6.5–8.1)

## 2021-02-11 LAB — CBC
HCT: 38.4 % (ref 36.0–46.0)
Hemoglobin: 13.3 g/dL (ref 12.0–15.0)
MCH: 31.4 pg (ref 26.0–34.0)
MCHC: 34.6 g/dL (ref 30.0–36.0)
MCV: 90.8 fL (ref 80.0–100.0)
Platelets: 255 10*3/uL (ref 150–400)
RBC: 4.23 MIL/uL (ref 3.87–5.11)
RDW: 13.4 % (ref 11.5–15.5)
WBC: 9.1 10*3/uL (ref 4.0–10.5)
nRBC: 0 % (ref 0.0–0.2)

## 2021-02-11 LAB — URINALYSIS, ROUTINE W REFLEX MICROSCOPIC
Bilirubin Urine: NEGATIVE
Glucose, UA: NEGATIVE mg/dL
Hgb urine dipstick: NEGATIVE
Ketones, ur: NEGATIVE mg/dL
Leukocytes,Ua: NEGATIVE
Nitrite: NEGATIVE
Protein, ur: NEGATIVE mg/dL
Specific Gravity, Urine: 1.009 (ref 1.005–1.030)
pH: 7 (ref 5.0–8.0)

## 2021-02-11 LAB — PROTEIN / CREATININE RATIO, URINE
Creatinine, Urine: 49.89 mg/dL
Total Protein, Urine: <6 mg/dL

## 2021-02-11 MED ORDER — ACETAMINOPHEN 500 MG PO TABS
1000.0000 mg | ORAL_TABLET | Freq: Once | ORAL | Status: AC
  Administered 2021-02-11: 1000 mg via ORAL
  Filled 2021-02-11: qty 2

## 2021-02-11 MED ORDER — METOCLOPRAMIDE HCL 10 MG PO TABS: 10.0000 mg | ORAL_TABLET | Freq: Four times a day (QID) | ORAL | 0 refills | Status: DC

## 2021-02-11 MED ORDER — METOCLOPRAMIDE HCL 10 MG PO TABS
10.0000 mg | ORAL_TABLET | Freq: Once | ORAL | Status: AC
  Administered 2021-02-11: 10 mg via ORAL
  Filled 2021-02-11: qty 1

## 2021-02-11 NOTE — Discharge Instructions (Signed)

## 2021-02-11 NOTE — MAU Note (Addendum)
...  Regina [name] is a 21 y.o. at [redacted]w[redacted]d here in MAU reporting: Sent over from the office for Pre-E evaluation. She is endorsing a current HA that began after waking up this morning around 0800. She states she has been experiencing HA's for one month now. Denies current swelling of hands, face, and feet. Endorses visual floaters today that are "black and yellow" that she last experienced in the office. Denies epigastric and RUQ pain. Denies LOF but states she has been experiencing vaginal spotting each day and has been occurring for more than one month. +FM.  Pain score:  6/10 HA  FHT: 158 Lab orders placed from triage: UA

## 2021-02-11 NOTE — MAU Provider Note (Signed)
History     CSN: [id number]  Arrival date and time: 02/11/21 1206   Event Date/Time   First Provider Initiated Contact with Patient 02/11/21 1238      Chief Complaint  Patient presents with   Hypertension   visual floaters   Headache   HPI Regina [name] is a 21 y.o. G3P1102 at [redacted]w[redacted]d who presents from the office for evaluation of a headache and elevated blood pressures. She has CHTN and is not on medication. She reports a headache for the last month. She has tried 1 tylenol at a time with no relief. She rates the headache a 6/10. She also reports intermittently seeing yellow spots in her vision. She reports left upper abdominal pain but none now. She denies any bleeding or leaking. Reports normal fetal movement. She has a hx of preeclampsia with her first pregnancy.  OB History     Gravida  3   Para  2   Term  1   Preterm  1   AB  0   Living  2      SAB  0   IAB  0   Ectopic  0   Multiple  0   Live Births  2           Past Medical History:  Diagnosis Date   Chronic hypertension 09/19/2019   GDM (gestational diabetes mellitus) 07/19/2019   History of preterm delivery 08/02/2019   G1 at [redacted]w[redacted]d due to preE   History of severe pre-eclampsia 04/02/2019   Hx of [place] spotted fever 07/2015    Past Surgical History:  Procedure Laterality Date   NO PAST SURGERIES      Family History  Problem Relation Age of Onset   Hypertension Mother     Social History   Tobacco Use   Smoking status: Never   Smokeless tobacco: Never  Vaping Use   Vaping Use: Never used  Substance Use Topics   Alcohol use: Never   Drug use: Never    Allergies: No Known Allergies  Medications Prior to Admission  Medication Sig Dispense Refill Last Dose   aspirin EC 81 MG tablet Take 1 tablet (81 mg total) by mouth daily. Swallow whole. 30 tablet 11 02/10/2021   Prenatal Vit-Fe Fumarate-FA (PREPLUS) 27-1 MG TABS Take 1 tablet by mouth daily. 30 tablet 11 02/10/2021     Review of Systems  Constitutional: Negative.  Negative for fatigue and fever.  HENT: Negative.    Respiratory: Negative.  Negative for shortness of breath.   Cardiovascular: Negative.  Negative for chest pain.  Gastrointestinal:  Negative for abdominal pain, constipation, diarrhea, nausea and vomiting.  Genitourinary: Negative.  Negative for dysuria, vaginal bleeding and vaginal discharge.  Neurological:  Positive for headaches. Negative for dizziness.  Physical Exam   Blood pressure 132/85, pulse 100, temperature 98.9 F (37.2 C), temperature source Oral, last menstrual period 09/10/2020, SpO2 99 %, currently breastfeeding.  Patient Vitals for the past 24 hrs:  BP Temp Temp src Pulse SpO2  02/11/21 1401 135/78 -- -- 86 --  02/11/21 1345 126/70 -- -- 86 100 %  02/11/21 1330 132/78 -- -- 89 99 %  02/11/21 1315 131/69 -- -- 92 99 %  02/11/21 1300 129/76 -- -- 90 98 %  02/11/21 1245 (!) 141/88 -- -- 93 99 %  02/11/21 1219 132/85 98.9 F (37.2 C) Oral 100 99 %     Physical Exam Vitals and nursing note reviewed.  Constitutional:  General: She is not in acute distress.    Appearance: She is well-developed.  HENT:     Head: Normocephalic.  Eyes:     Pupils: Pupils are equal, round, and reactive to light.  Cardiovascular:     Rate and Rhythm: Normal rate and regular rhythm.     Heart sounds: Normal heart sounds.  Pulmonary:     Effort: Pulmonary effort is normal. No respiratory distress.     Breath sounds: Normal breath sounds.  Abdominal:     General: Bowel sounds are normal. There is no distension.     Palpations: Abdomen is soft.     Tenderness: There is no abdominal tenderness.  Skin:    General: Skin is warm and dry.  Neurological:     Mental Status: She is alert and oriented to person, place, and time.  Psychiatric:        Mood and Affect: Mood normal.        Behavior: Behavior normal.        Thought Content: Thought content normal.        Judgment:  Judgment normal.   FHT: 158 bpm  MAU Course  Procedures Results for orders placed or performed during the hospital encounter of 02/11/21 (from the past 24 hour(s))  Protein / creatinine ratio, urine     Status: None   Collection Time: 02/11/21 12:32 PM  Result Value Ref Range   Creatinine, Urine 49.89 mg/dL   Total Protein, Urine <6 mg/dL   Protein Creatinine Ratio        0.00 - 0.15 mg/mg[Cre]  Urinalysis, Routine w reflex microscopic Urine, Clean Catch     Status: Abnormal   Collection Time: 02/11/21 12:32 PM  Result Value Ref Range   Color, Urine STRAW (A) YELLOW   APPearance CLEAR CLEAR   Specific Gravity, Urine 1.009 1.005 - 1.030   pH 7.0 5.0 - 8.0   Glucose, UA NEGATIVE NEGATIVE mg/dL   Hgb urine dipstick NEGATIVE NEGATIVE   Bilirubin Urine NEGATIVE NEGATIVE   Ketones, ur NEGATIVE NEGATIVE mg/dL   Protein, ur NEGATIVE NEGATIVE mg/dL   Nitrite NEGATIVE NEGATIVE   Leukocytes,Ua NEGATIVE NEGATIVE  CBC     Status: None   Collection Time: 02/11/21 12:35 PM  Result Value Ref Range   WBC 9.1 4.0 - 10.5 K/uL   RBC 4.23 3.87 - 5.11 MIL/uL   Hemoglobin 13.3 12.0 - 15.0 g/dL   HCT 23.7 62.8 - 31.5 %   MCV 90.8 80.0 - 100.0 fL   MCH 31.4 26.0 - 34.0 pg   MCHC 34.6 30.0 - 36.0 g/dL   RDW 17.6 16.0 - 73.7 %   Platelets 255 150 - 400 K/uL   nRBC 0.0 0.0 - 0.2 %  Comprehensive metabolic panel     Status: Abnormal   Collection Time: 02/11/21 12:35 PM  Result Value Ref Range   Sodium 135 135 - 145 mmol/L   Potassium 3.7 3.5 - 5.1 mmol/L   Chloride 106 98 - 111 mmol/L   CO2 21 (L) 22 - 32 mmol/L   Glucose, Bld 131 (H) 70 - 99 mg/dL   BUN 6 6 - 20 mg/dL   Creatinine, Ser 1.06 0.44 - 1.00 mg/dL   Calcium 8.6 (L) 8.9 - 10.3 mg/dL   Total Protein 6.3 (L) 6.5 - 8.1 g/dL   Albumin 3.1 (L) 3.5 - 5.0 g/dL   AST 15 15 - 41 U/L   ALT 11 0 - 44 U/L   Alkaline  Phosphatase 42 38 - 126 U/L   Total Bilirubin 0.3 0.3 - 1.2 mg/dL   GFR, Estimated >95 >28 mL/min   Anion gap 8 5 - 15     MDM UA CBC, CMP, Protein/creat ratio Tylenol and reglan PO Patient reports resolution of HA  Reassurance provided that this is not preeclampsia and discussed warning signs at length  Assessment and Plan   1. Chronic hypertension affecting pregnancy   2. [redacted] weeks gestation of pregnancy    -Discharge home in stable condition -Rx for reglan sent to patient's pharmacy -Second trimester precautions discussed -Patient advised to follow-up with OB as scheduled for prenatal care -Patient may return to MAU as needed or if her condition were to change or worsen   Rolm Bookbinder CNM 02/11/2021, 12:38 PM

## 2021-02-11 NOTE — MAU Provider Note (Signed)
History     CSN: [id number]  Arrival date and time: 02/11/21 1206   Event Date/Time   First Provider Initiated Contact with Patient 02/11/21 1238      Chief Complaint  Patient presents with   Hypertension   visual floaters   Headache    Regina [name] is a 21 yo G3P2 with a history of pre-E currently at [redacted]w[redacted]d who presented for pre-E evaluation after having elevated BP with headache at an office visit this morning. She says the headache began at 0800, and pain was 6/10. She has had these headaches every day for the past month with bilateral sharp pains and yellow and black spots in her vision. These appeared before she was diagnosed with pre-E in the prior pregnancy. She takes 1 extra strength tylenol every 6 hours, but this does not provide relief. She said headaches remain all day and cause her to sleep more. She endorses dizziness, nausea, decreased appetite, vaginal spotting, shortness of breath, and occasional swelling of her hands and feet. She has other occasional pains including ear pain, sharp LUQ pain, sharp RLQ pain radiating to her pelvis, and sharp pain along her sides that make it difficult to sleep at night.  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 Swallow whole. 30 tablet 11 02/10/2021   Prenatal Vit-Fe Fumarate-FA (PREPLUS) 27-1 MG TABS Take 1 tablet by mouth daily. 30 tablet 11 02/10/2021    Review of Systems  Constitutional:  Positive for activity change, appetite change and fatigue. Negative for chills.  HENT:  Positive for ear pain.   Eyes:  Positive for visual disturbance.  Respiratory:  Positive for chest tightness and shortness of breath.   Cardiovascular:  Positive for chest pain. Negative for leg swelling.  Gastrointestinal:  Positive for abdominal pain and nausea. Negative for constipation, diarrhea and vomiting.  Genitourinary:  Negative for difficulty urinating and vaginal pain.       Vaginal spotting  Musculoskeletal:  Negative for gait problem.  Neurological:  Positive for dizziness and headaches.  Psychiatric/Behavioral:  Positive for sleep disturbance.   Physical Exam   Blood pressure (!) 141/88, pulse 93, temperature 98.9 F (37.2 C), temperature source Oral,  last menstrual period 09/10/2020, SpO2 99 %, currently breastfeeding.  Physical Exam Vitals and nursing note reviewed.  HENT:     Head: Normocephalic and atraumatic.  Cardiovascular:     Rate and Rhythm: Normal rate and regular rhythm.     Heart sounds: No murmur heard. Pulmonary:     Effort: Pulmonary effort is normal.     Breath sounds: Normal breath sounds.  Abdominal:     Comments: Tenderness to palpation of RLQ  Neurological:     Mental Status: She is alert.  Psychiatric:        Mood and Affect: Mood normal.        Behavior: Behavior normal.    MAU Course  Procedures  MDM Patient being evaluated in MAU for pre-E. Arrived with initial BP 132/85. Highest reading 141/88. Ordered CBC, CMP, urinalysis, and protein/creatinine ratio. Glucose  elevated but otherwise normal results. Given Tylenol 1,000mg  and Reglan 10mg  at 1334.  1355 - Patient says headache improving.   Reassured patient that her daily headaches are less likely caused by pre-E. Encouraged patient to take 2 extra strength Tylenol instead of 1, and discussed adding Reglan and magnesium. Discussed importance of increasing food and water intake, and patient expressed understanding.  Assessment and Plan  Regina [name] is a 21 yo G3P2 with a history of pre-E currently at [redacted]w[redacted]d who presented for pre-E evaluation after having elevated BP with headache at an office visit this morning in stable condition.  Headache - Take Tylenol 1,000 mg daily and add Reglan 10mg  - Increase food and water intake - Magnesium supplement - Continue to monitor for changes in severity of headaches  History of pre-E and chronic HTN - Continue ASA 81mg  daily  Pregnancy at [redacted] weeks gestation - Routine prenatal care - Continue prenatal vitamins  Danie Chandler 02/11/2021, 1:02 PM

## 2021-02-11 NOTE — Patient Instructions (Signed)
Return to office for any scheduled appointments. Call the office or go to the MAU at [facility] [place] if:  You begin to have strong, frequent contractions  Your water breaks.  Sometimes it is a big gush of fluid, sometimes it is just a trickle that keeps getting your panties wet or running down your legs  You have vaginal bleeding.  It is normal to have a small amount of spotting if your cervix was checked.   You do not feel your baby moving like normal.  If you do not, get something to eat and drink and lay down and focus on feeling your baby move.   If your baby is still not moving like normal, you should call the office or go to MAU.  Any other obstetric concerns.   

## 2021-02-11 NOTE — Progress Notes (Signed)
PRENATAL VISIT NOTE  Subjective:  Regina [name] is a 21 y.o. [phone] at [redacted]w[redacted]d being seen today for ongoing prenatal care.  She is currently monitored for the following issues for this high-risk pregnancy and has History of severe preeclampsia, prior pregnancy, currently pregnant; Chronic hypertension during pregnancy; and Supervision of high risk pregnancy, antepartum on their problem list.  Patient reports headache, R flank pain and seeing spots. Also swelling in hands and feet.  Has history of severe preeclampsia in previous pregnancy.  Also noticed white and clumpy vaginal discharge for the past two days, no irritation but wants evaluation. Contractions: Not present. Vag. Bleeding: None.  Movement: Present. Denies leaking of fluid.   The following portions of the patient's history were reviewed and updated as appropriate: allergies, current medications, past family history, past medical history, past social history, past surgical history and problem list.   Objective:   Vitals:   02/11/21 1054 02/11/21 1106  BP: (!) 141/89 139/80  Pulse: 90 92  Weight: 182 lb (82.6 kg)     Fetal Status: Fetal Heart Rate (bpm): 150   Movement: Present     General:  Alert, oriented and cooperative. Patient is in no acute distress.  Skin: Skin is warm and dry. No rash noted.   Cardiovascular: Normal heart rate noted  Respiratory: Normal respiratory effort, no problems with respiration noted  Abdomen: Soft, gravid, appropriate for gestational age, RUQ/flank pain on palpation.  Pain/Pressure: Present     Pelvic: Cervical exam deferred        Extremities: Normal range of motion.  Edema: Trace  Mental Status: Normal mood and affect. Normal behavior. Normal judgment and thought content.   Imaging: [place] MFM OB DETAIL +14 WK  Result Date: 01/19/2021 ----------------------------------------------------------------------  OBSTETRICS REPORT                       (Signed Final 01/19/2021 08:56 am)  ---------------------------------------------------------------------- Patient Info  ID #:       [id number]                          D.O.B.:  09/21/2000 (20 yrs)  Name:       Regina [name]                   Visit Date: 01/19/2021 08:15 am ---------------------------------------------------------------------- Performed By  Attending:        Johnell Comings MD         Ref. Address:     Faculty  Performed By:     Lelan Pons RDMS       [facility] [facility]  Referred By:      Mora Bellman MD ---------------------------------------------------------------------- Orders  #  Description                           Code        Ordered By  1  [place] MFM OB DETAIL +14 [place]               76811.01    PEGGY CONSTANT ----------------------------------------------------------------------  #  Order #                     Accession #                Episode #  1  [id number]                   [id number]                 [id number] ---------------------------------------------------------------------- Indications  Poor obstetric history: Previous               O09.299  preeclampsia / eclampsia/gestational HTN  Hypertension - Chronic/Pre-existing            O10.019  LR NIPS  Obesity complicating pregnancy, second         O99.212  trimester BMI 37  Encounter for antenatal screening for          Z36.3  malformations  [redacted] weeks gestation of pregnancy                Z3A.18 ---------------------------------------------------------------------- Fetal Evaluation  Num Of Fetuses:         1  Fetal Heart Rate(bpm):  158  Cardiac Activity:       Observed  Presentation:           Cephalic  Placenta:               Anterior  P. Cord Insertion:      Visualized, central  Amniotic Fluid  AFI FV:      Within normal limits                               Largest Pocket(cm)                              4.63 ---------------------------------------------------------------------- Biometry  BPD:      43.4  mm     G. Age:  19w 1d         69  %    CI:        74.26   %    70 - 86                                                          FL/HC:      18.6   %    16.1 - 18.3  HC:      159.9  mm     G. Age:  18w 6d         48  %    HC/AC:  1.15        1.09 - 1.39  AC:      139.3  mm     G. Age:  19w 2d         67  %    FL/BPD:     68.4   %  FL:       29.7  mm     G. Age:  19w 1d         60  %    FL/AC:      21.3   %    20 - 24  HUM:      28.3  mm     G. Age:  19w 1d         63  %  CER:      18.6  mm     G. Age:  18w 2d         22  %  NFT:       2.8  mm  LV:          6  mm  CM:        3.8  mm  Est. FW:     280  gm    0 lb 10 oz      75  % ---------------------------------------------------------------------- OB History  Gravidity:    3         Term:   1        Prem:   1  Living:       2 ---------------------------------------------------------------------- Gestational Age  LMP:           18w 5d        Date:  09/10/20                 EDD:   06/17/21  U/S Today:     19w 1d                                        EDD:   06/14/21  Best:          18w 5d     Det. By:  LMP  (09/10/20)          EDD:   06/17/21 ---------------------------------------------------------------------- Anatomy  Cranium:               Appears normal         Aortic Arch:            Appears normal  Cavum:                 Appears normal         Ductal Arch:            Appears normal  Ventricles:            Appears normal         Diaphragm:              Appears normal  Choroid Plexus:        Appears normal         Stomach:                Appears normal, left  sided  Cerebellum:            Appears normal         Abdomen:                Appears normal  Posterior Fossa:       Appears normal         Abdominal Wall:          Appears nml (cord                                                                        insert, abd wall)  Nuchal Fold:           Appears normal         Cord Vessels:           Appears normal (3                                                                        vessel cord)  Face:                  Appears normal         Kidneys:                Appear normal                         (orbits and profile)  Lips:                  Appears normal         Bladder:                Appears normal  Thoracic:              Appears normal         Spine:                  Appears normal  Heart:                 Appears normal         Upper Extremities:      Appears normal                         (4CH, axis, and                         situs)  RVOT:                  Appears normal         Lower Extremities:      Appears normal  LVOT:                  Appears normal  Other:  VC, 3VV and 3VTV visualized. Fetus appears to be a female. ---------------------------------------------------------------------- Cervix Uterus Adnexa  Cervix  Length:  3.69  cm.  Normal appearance by transabdominal scan.  Right Ovary  Visualized.  Left Ovary  Visualized. ---------------------------------------------------------------------- Comments  This patient was seen for a detailed fetal anatomy scan due  to chronic hypertension that is not treated with any  medications.  Her last pregnancy was complicated by severe  preeclampsia requiring an indicated preterm delivery at 36  weeks and 4 days.  She denies any other significant past medical history and  denies any problems in her current pregnancy.  She had a cell free DNA test earlier in her pregnancy which  indicated a low risk for trisomy 35, 47, and 13. A female fetus is  predicted.  She was informed that the fetal growth and amniotic fluid  level were appropriate for her gestational age.  There were no obvious fetal anomalies noted on today's  ultrasound exam.  The patient was informed that  anomalies may be missed due  to technical limitations. If the fetus is in a suboptimal position  or maternal habitus is increased, visualization of the fetus in  the maternal uterus may be impaired.  Due to chronic hypertension, we will continue to follow her  with growth ultrasounds throughout her pregnancy.  A follow-up exam was scheduled in 4 weeks. ----------------------------------------------------------------------                   Johnell Comings, MD Electronically Signed Final Report   01/19/2021 08:56 am ----------------------------------------------------------------------   Assessment and Plan:  Pregnancy: [id number] at [redacted]w[redacted]d 1. Chronic hypertension during pregnancy 2. History of severe preeclampsia, prior pregnancy, currently pregnant Concerned about superimposed preeclampsia, will send to MAU for further evaluation. MAU providers and RN in charge notified.  3. Vaginal discharge during pregnancy in second trimester - Cervicovaginal ancillary only( [place])  self-swab done, will follow up results and manage accordingly.  4. [redacted] weeks gestation of pregnancy 5. Supervision of high risk pregnancy, antepartum Follow up scans as per MFM. Preterm labor symptoms and general obstetric precautions including but not limited to vaginal bleeding, contractions, leaking of fluid and fetal movement were reviewed in detail with the patient. Please refer to After Visit Summary for other counseling recommendations.   Return in about 1 week (around 02/18/2021) for OFFICE OB VISIT (MD only), follow up MAU visit on 02/11/21.  Future Appointments  Date Time Provider [place]  02/15/2021  7:30 AM [facility] NURSE [facility]  02/15/2021  7:45 AM [facility] US5 [facility] [place]    Verita Schneiders, MD

## 2021-02-12 LAB — CERVICOVAGINAL ANCILLARY ONLY
Bacterial Vaginitis (gardnerella): POSITIVE — AB
Candida Glabrata: NEGATIVE
Candida Vaginitis: NEGATIVE
Chlamydia: NEGATIVE
Comment: NEGATIVE
Comment: NEGATIVE
Comment: NEGATIVE
Comment: NEGATIVE
Comment: NEGATIVE
Comment: NORMAL
Neisseria Gonorrhea: NEGATIVE
Trichomonas: NEGATIVE

## 2021-02-15 ENCOUNTER — Other Ambulatory Visit: Payer: Self-pay | Admitting: *Deleted

## 2021-02-15 ENCOUNTER — Other Ambulatory Visit: Payer: Self-pay

## 2021-02-15 ENCOUNTER — Encounter: Payer: Self-pay | Admitting: *Deleted

## 2021-02-15 ENCOUNTER — Ambulatory Visit: Payer: Medicaid Other | Admitting: *Deleted

## 2021-02-15 ENCOUNTER — Ambulatory Visit: Payer: Medicaid Other | Attending: Obstetrics

## 2021-02-15 VITALS — BP 128/71 | HR 83

## 2021-02-15 DIAGNOSIS — Z6832 Body mass index (BMI) 32.0-32.9, adult: Secondary | ICD-10-CM | POA: Diagnosis present

## 2021-02-15 DIAGNOSIS — O10919 Unspecified pre-existing hypertension complicating pregnancy, unspecified trimester: Secondary | ICD-10-CM | POA: Diagnosis present

## 2021-02-15 DIAGNOSIS — O10912 Unspecified pre-existing hypertension complicating pregnancy, second trimester: Secondary | ICD-10-CM

## 2021-02-15 DIAGNOSIS — O0990 Supervision of high risk pregnancy, unspecified, unspecified trimester: Secondary | ICD-10-CM | POA: Diagnosis present

## 2021-02-15 DIAGNOSIS — O10012 Pre-existing essential hypertension complicating pregnancy, second trimester: Secondary | ICD-10-CM

## 2021-02-15 DIAGNOSIS — O09292 Supervision of pregnancy with other poor reproductive or obstetric history, second trimester: Secondary | ICD-10-CM

## 2021-02-15 DIAGNOSIS — Z3A22 22 weeks gestation of pregnancy: Secondary | ICD-10-CM

## 2021-02-15 MED ORDER — METRONIDAZOLE 500 MG PO TABS: 500.0000 mg | ORAL_TABLET | Freq: Two times a day (BID) | ORAL | 0 refills | Status: AC

## 2021-02-15 NOTE — Addendum Note (Signed)
Addended by: Jaynie Collins A on: 02/15/2021 11:39 AM   Modules accepted: Orders

## 2021-02-19 ENCOUNTER — Encounter: Payer: Self-pay | Admitting: Family Medicine

## 2021-02-19 ENCOUNTER — Telehealth (INDEPENDENT_AMBULATORY_CARE_PROVIDER_SITE_OTHER): Payer: Medicaid Other | Admitting: Family Medicine

## 2021-02-19 VITALS — BP 119/88 | HR 75

## 2021-02-19 DIAGNOSIS — Z3A23 23 weeks gestation of pregnancy: Secondary | ICD-10-CM

## 2021-02-19 DIAGNOSIS — O0990 Supervision of high risk pregnancy, unspecified, unspecified trimester: Secondary | ICD-10-CM

## 2021-02-19 DIAGNOSIS — O0992 Supervision of high risk pregnancy, unspecified, second trimester: Secondary | ICD-10-CM

## 2021-02-19 DIAGNOSIS — O26899 Other specified pregnancy related conditions, unspecified trimester: Secondary | ICD-10-CM

## 2021-02-19 DIAGNOSIS — O26892 Other specified pregnancy related conditions, second trimester: Secondary | ICD-10-CM

## 2021-02-19 DIAGNOSIS — Z8632 Personal history of gestational diabetes: Secondary | ICD-10-CM | POA: Insufficient documentation

## 2021-02-19 DIAGNOSIS — R519 Headache, unspecified: Secondary | ICD-10-CM

## 2021-02-19 MED ORDER — CYCLOBENZAPRINE HCL 10 MG PO TABS: 10.0000 mg | ORAL_TABLET | Freq: Three times a day (TID) | ORAL | 0 refills | Status: DC | PRN

## 2021-02-19 NOTE — Progress Notes (Signed)
Was seen in MAU on 2/9 for CHTN. Judeth Cornfield, RN

## 2021-02-19 NOTE — Progress Notes (Signed)
I connected with Regina [name] 02/19/21 at 10:15 AM EST by: MyChart video and verified that I am speaking with the correct person using two identifiers.  Patient is located at home and provider is located at [facility].     The purpose of this virtual visit is to provide medical care while limiting exposure to the novel coronavirus. I discussed the limitations, risks, security and privacy concerns of performing an evaluation and management service by MyChart video and the availability of in person appointments. I also discussed with the patient that there may be a patient responsible charge related to this service. By engaging in this virtual visit, you consent to the provision of healthcare.  Additionally, you authorize for your insurance to be billed for the services provided during this visit.  The patient expressed understanding and agreed to proceed.  The following staff members participated in the virtual visit:  Evern Core    PRENATAL VISIT NOTE  Subjective:  Regina [name] is a 21 y.o. [id number] at [redacted]w[redacted]d  for telehealth visit for ongoing prenatal care.  She is currently monitored for the following issues for this high-risk pregnancy and has History of severe preeclampsia, prior pregnancy, currently pregnant; Chronic hypertension during pregnancy; Supervision of high risk pregnancy, antepartum; and History of gestational diabetes on their problem list.  Patient reports no complaints.  Contractions: Not present. Vag. Bleeding: None.  Movement: Present. Denies leaking of fluid.   The following portions of the patient's history were reviewed and updated as appropriate: allergies, current medications, past family history, past medical history, past social history, past surgical history and problem list.   Objective:   Vitals:   02/19/21 1027  BP: 119/88  Pulse: 75   Self-Obtained  Fetal Status:     Movement: Present     Assessment and Plan:  Pregnancy: [id number] at [redacted]w[redacted]d 1. Supervision  of high risk pregnancy, antepartum Up to date Needs 28 wk labs next visit BP is WNL today  2. Headache in pregnancy, antepartum BP is WNL today HA is still happening about 3-4 times per week. Reports similar in prior pregnancy Reviewed taking BP with HA Recommended using 1000mg  of Tylenol Consider rx for fioricet if peristent - cyclobenzaprine (FLEXERIL) 10 MG tablet; Take 1 tablet (10 mg total) by mouth every 8 (eight) hours as needed for muscle spasms.  Dispense: 30 tablet; Refill: 0  3. History of gestational diabetes 2hr GTT next visit  4. Elevated BP Today BP is very normal. I am not concerned about SIPE.   Preterm labor symptoms and general obstetric precautions including but not limited to vaginal bleeding, contractions, leaking of fluid and fetal movement were reviewed in detail with the patient.  Return in about 4 weeks (around 03/19/2021) for Routine prenatal care, 28 wk labs.  Future Appointments  Date Time Provider [place]  03/15/2021  8:00 AM [facility] NURSE [facility]  03/15/2021  8:15 AM [facility] US2 [facility] [place]     Time spent on virtual visit: 15 minutes  Caren Macadam, MD

## 2021-02-23 ENCOUNTER — Other Ambulatory Visit: Payer: Self-pay

## 2021-02-23 ENCOUNTER — Encounter: Payer: Self-pay | Admitting: Family Medicine

## 2021-02-23 DIAGNOSIS — B379 Candidiasis, unspecified: Secondary | ICD-10-CM

## 2021-02-23 MED ORDER — TERCONAZOLE 0.4 % VA CREA: 1.0000 | TOPICAL_CREAM | Freq: Every day | VAGINAL | 0 refills | Status: DC

## 2021-02-23 NOTE — Progress Notes (Signed)
tera 

## 2021-03-15 ENCOUNTER — Ambulatory Visit: Payer: Medicaid Other | Attending: Maternal & Fetal Medicine

## 2021-03-15 ENCOUNTER — Ambulatory Visit: Payer: Medicaid Other | Admitting: *Deleted

## 2021-03-15 ENCOUNTER — Other Ambulatory Visit: Payer: Self-pay

## 2021-03-15 ENCOUNTER — Other Ambulatory Visit: Payer: Self-pay | Admitting: *Deleted

## 2021-03-15 VITALS — BP 131/83 | HR 81

## 2021-03-15 DIAGNOSIS — O10912 Unspecified pre-existing hypertension complicating pregnancy, second trimester: Secondary | ICD-10-CM | POA: Insufficient documentation

## 2021-03-15 DIAGNOSIS — Z3A26 26 weeks gestation of pregnancy: Secondary | ICD-10-CM

## 2021-03-15 DIAGNOSIS — O09292 Supervision of pregnancy with other poor reproductive or obstetric history, second trimester: Secondary | ICD-10-CM

## 2021-03-15 DIAGNOSIS — Z8632 Personal history of gestational diabetes: Secondary | ICD-10-CM | POA: Diagnosis present

## 2021-03-15 DIAGNOSIS — O10012 Pre-existing essential hypertension complicating pregnancy, second trimester: Secondary | ICD-10-CM | POA: Diagnosis not present

## 2021-03-15 DIAGNOSIS — O99212 Obesity complicating pregnancy, second trimester: Secondary | ICD-10-CM | POA: Diagnosis not present

## 2021-03-15 DIAGNOSIS — O0990 Supervision of high risk pregnancy, unspecified, unspecified trimester: Secondary | ICD-10-CM | POA: Diagnosis present

## 2021-03-15 DIAGNOSIS — O09212 Supervision of pregnancy with history of pre-term labor, second trimester: Secondary | ICD-10-CM

## 2021-03-15 DIAGNOSIS — Z362 Encounter for other antenatal screening follow-up: Secondary | ICD-10-CM

## 2021-03-15 DIAGNOSIS — E669 Obesity, unspecified: Secondary | ICD-10-CM

## 2021-03-19 ENCOUNTER — Other Ambulatory Visit: Payer: Self-pay

## 2021-03-19 ENCOUNTER — Ambulatory Visit (INDEPENDENT_AMBULATORY_CARE_PROVIDER_SITE_OTHER): Payer: Medicaid Other | Admitting: Obstetrics & Gynecology

## 2021-03-19 ENCOUNTER — Other Ambulatory Visit: Payer: Medicaid Other

## 2021-03-19 ENCOUNTER — Other Ambulatory Visit: Payer: Self-pay | Admitting: General Practice

## 2021-03-19 ENCOUNTER — Encounter: Payer: Self-pay | Admitting: Obstetrics & Gynecology

## 2021-03-19 ENCOUNTER — Encounter: Payer: Self-pay | Admitting: Family Medicine

## 2021-03-19 VITALS — BP 135/84 | HR 88 | Wt 187.0 lb

## 2021-03-19 DIAGNOSIS — O0990 Supervision of high risk pregnancy, unspecified, unspecified trimester: Secondary | ICD-10-CM

## 2021-03-19 DIAGNOSIS — Z23 Encounter for immunization: Secondary | ICD-10-CM | POA: Diagnosis not present

## 2021-03-19 DIAGNOSIS — Z8632 Personal history of gestational diabetes: Secondary | ICD-10-CM

## 2021-03-19 DIAGNOSIS — O09299 Supervision of pregnancy with other poor reproductive or obstetric history, unspecified trimester: Secondary | ICD-10-CM

## 2021-03-19 DIAGNOSIS — O10919 Unspecified pre-existing hypertension complicating pregnancy, unspecified trimester: Secondary | ICD-10-CM

## 2021-03-19 DIAGNOSIS — Z3A27 27 weeks gestation of pregnancy: Secondary | ICD-10-CM

## 2021-03-19 NOTE — Progress Notes (Signed)
PRENATAL VISIT NOTE  Subjective:  Regina [name] is a 21 y.o. [phone] at [redacted]w[redacted]d being seen today for ongoing prenatal care.  She is currently monitored for the following issues for this high-risk pregnancy and has History of severe preeclampsia, prior pregnancy, currently pregnant; Chronic hypertension during pregnancy; Supervision of high risk pregnancy, antepartum; and History of gestational diabetes on their problem list.  Patient reports no complaints.  Contractions: Irritability. Vag. Bleeding: Scant.  Movement: Present. Denies leaking of fluid.   The following portions of the patient's history were reviewed and updated as appropriate: allergies, current medications, past family history, past medical history, past social history, past surgical history and problem list.   Objective:   Vitals:   03/19/21 0918  BP: 135/84  Pulse: 88  Weight: 187 lb (84.8 kg)    Fetal Status: Fetal Heart Rate (bpm): 160   Movement: Present     General:  Alert, oriented and cooperative. Patient is in no acute distress.  Skin: Skin is warm and dry. No rash noted.   Cardiovascular: Normal heart rate noted  Respiratory: Normal respiratory effort, no problems with respiration noted  Abdomen: Soft, gravid, appropriate for gestational age.  Pain/Pressure: Present     Pelvic: Cervical exam deferred        Extremities: Normal range of motion.  Edema: None  Mental Status: Normal mood and affect. Normal behavior. Normal judgment and thought content.   [place] MFM OB FOLLOW UP  Result Date: 03/15/2021 ----------------------------------------------------------------------  OBSTETRICS REPORT                       (Signed Final 03/15/2021 09:42 am) ---------------------------------------------------------------------- Patient Info  ID #:       [id number]                          D.O.B.:  August 19, 2000 (21 yrs)  Name:       Regina [name]                   Visit Date: 03/15/2021 07:54 am  ---------------------------------------------------------------------- Performed By  Attending:        Noralee Space MD        Ref. Address:     [address]                                                             [place], [place]                                                             [address]  Performed By:     Marcellina Millin       [facility]  Referred By:      [facility] ---------------------------------------------------------------------- Orders  #  Description                           Code        Ordered By  1  [place] MFM OB FOLLOW UP                   [phone]    Lin Landsman ----------------------------------------------------------------------  #  Order #                     Accession #                Episode #  1  [id number]                   [id number]                 [id number] ---------------------------------------------------------------------- Indications  Poor obstetric history: Previous               O09.299  preeclampsia / eclampsia/gestational HTN  Obesity complicating pregnancy, second         O99.212  trimester BMI 32  Hypertension - Chronic/Pre-existing (no        O10.019  meds)  Encounter for other antenatal screening        Z36.2  follow-up  Poor obstetric history: Previous preterm       O09.219  delivery, antepartum (36+4 weeks,  preeclampsia)  [redacted] weeks gestation of pregnancy                Z3A.26  LR NIPS ---------------------------------------------------------------------- Fetal Evaluation  Num Of Fetuses:         1  Fetal Heart Rate(bpm):  158  Cardiac Activity:       Observed  Presentation:           Breech  Placenta:               Anterior  P. Cord  Insertion:      Previously Visualized  Amniotic Fluid  AFI FV:      Within normal limits  Largest Pocket(cm)                              4.61 ---------------------------------------------------------------------- Biometry  BPD:      66.2  mm     G. Age:  26w 5d         43  %    CI:         71.4   %    70 - 86                                                          FL/HC:      19.8   %    18.6 - 20.4  HC:      249.5  mm     G. Age:  27w 1d         40  %    HC/AC:      1.05        1.05 - 1.21  AC:      238.7  mm     G. Age:  28w 1d         86  %    FL/BPD:     74.6   %    71 - 87  FL:       49.4  mm     G. Age:  26w 5d         38  %    FL/AC:      20.7   %    20 - 24  HUM:      45.9  mm     G. Age:  27w 0d         59  %  CER:      30.4  mm     G. Age:  26w 3d         57  %  LV:        2.9  mm  Est. FW:    1077  gm      2 lb 6 oz     75  % ---------------------------------------------------------------------- OB History  Gravidity:    3         Term:   1        Prem:   1  Living:       2 ---------------------------------------------------------------------- Gestational Age  LMP:           26w 4d        Date:  09/10/20                 EDD:   06/17/21  U/S Today:     27w 1d                                        EDD:   06/13/21  Best:          26w 4d     Det. By:  LMP  (09/10/20)          EDD:   06/17/21 ---------------------------------------------------------------------- Anatomy  Cranium:               Appears normal  LVOT:                   Previously seen  Cavum:                 Appears normal         Aortic Arch:            Previously seen  Ventricles:            Appears normal         Ductal Arch:            Previously seen  Choroid Plexus:        Previously seen        Diaphragm:              Appears normal  Cerebellum:            Appears normal         Stomach:                Appears normal, left                                                                        sided   Posterior Fossa:       Previously seen        Abdomen:                Appears normal  Nuchal Fold:           Previously seen        Abdominal Wall:         Previously seen  Face:                  Orbits and profile     Cord Vessels:           Previously seen                         previously seen  Lips:                  Previously seen        Kidneys:                Appear normal  Palate:                Previously seen        Bladder:                Appears normal  Thoracic:              Previously seen        Spine:                  Previously seen  Heart:                 Appears normal         Upper Extremities:      Previously seen                         (4CH, axis, and  situs)  RVOT:                  Previously seen        Lower Extremities:      Previously seen  Other:  VC, 3VV and 3VTV, Nasal bone and heels previously visualized. Female          gender previously seen. ---------------------------------------------------------------------- Cervix Uterus Adnexa  Cervix  Not visualized (advanced GA >24wks)  Uterus  No abnormality visualized.  Right Ovary  Within normal limits.  Left Ovary  Within normal limits.  Cul De Sac  No free fluid seen.  Adnexa  No adnexal mass visualized. ---------------------------------------------------------------------- Impression  Chronic hypertension.  Well-controlled without  antihypertensives.  Blood pressure today at her office is  131/83 mmHg.  Fetal growth is appropriate for gestational age .Amniotic fluid  is normal and good fetal activity is seen . ---------------------------------------------------------------------- Recommendations  -An appointment was made for her to return in 4 weeks for  fetal growth assessment. ----------------------------------------------------------------------                  Noralee Space, MD Electronically Signed Final Report   03/15/2021 09:42 am ----------------------------------------------------------------------    Assessment and Plan:  Pregnancy: [id number] at [redacted]w[redacted]d 1. Chronic hypertension during pregnancy 2. History of severe preeclampsia, prior pregnancy, currently pregnant BP in 130s/80s, will continue to monitor.  On ASA. Surveillance labs today. Continue growth scans as per MFM. Preeclampsia precautions reviewed. - Comprehensive metabolic panel - Protein / creatinine ratio, urine  3. History of gestational diabetes GTT today, will follow up results and manage accordingly.  4. [redacted] weeks gestation of pregnancy 5. Supervision of high risk pregnancy, antepartum Third trimester labs today, Tdap given.  - Tdap vaccine greater than or equal to 7yo IM  Preterm labor symptoms and general obstetric precautions including but not limited to vaginal bleeding, contractions, leaking of fluid and fetal movement were reviewed in detail with the patient. Please refer to After Visit Summary for other counseling recommendations.   Return in about 2 weeks (around 04/02/2021) for OFFICE OB VISIT (MD only).  Future Appointments  Date Time Provider Department Center  03/19/2021 10:15 AM Tereso Newcomer, MD [facility]  04/12/2021 12:30 PM [facility] NURSE [facility]  04/12/2021 12:45 PM [facility] US5 [facility]    Jaynie Collins, MD

## 2021-03-20 LAB — COMPREHENSIVE METABOLIC PANEL
ALT: 15 IU/L (ref 0–32)
AST: 18 IU/L (ref 0–40)
Albumin/Globulin Ratio: 1.3 (ref 1.2–2.2)
Albumin: 3.6 g/dL — ABNORMAL LOW (ref 3.9–5.0)
Alkaline Phosphatase: 56 IU/L (ref 44–121)
BUN/Creatinine Ratio: 9 (ref 9–23)
BUN: 4 mg/dL — ABNORMAL LOW (ref 6–20)
Bilirubin Total: <0.2 mg/dL (ref 0.0–1.2)
CO2: 17 mmol/L — ABNORMAL LOW (ref 20–29)
Calcium: 8.5 mg/dL — ABNORMAL LOW (ref 8.7–10.2)
Chloride: 105 mmol/L (ref 96–106)
Creatinine, Ser: 0.44 mg/dL — ABNORMAL LOW (ref 0.57–1.00)
Globulin, Total: 2.7 g/dL (ref 1.5–4.5)
Glucose: 130 mg/dL — ABNORMAL HIGH (ref 70–99)
Potassium: 3.6 mmol/L (ref 3.5–5.2)
Sodium: 138 mmol/L (ref 134–144)
Total Protein: 6.3 g/dL (ref 6.0–8.5)
eGFR: 141 mL/min/{1.73_m2} (ref >59)

## 2021-03-20 LAB — CBC
Hematocrit: 38.9 % (ref 34.0–46.6)
Hemoglobin: 12.9 g/dL (ref 11.1–15.9)
MCH: 31 pg (ref 26.6–33.0)
MCHC: 33.2 g/dL (ref 31.5–35.7)
MCV: 94 fL (ref 79–97)
Platelets: 234 10*3/uL (ref 150–450)
RBC: 4.16 x10E6/uL (ref 3.77–5.28)
RDW: 13 % (ref 11.7–15.4)
WBC: 8.7 10*3/uL (ref 3.4–10.8)

## 2021-03-20 LAB — GLUCOSE TOLERANCE, 2 HOURS W/ 1HR
Glucose, 1 hour: 192 mg/dL — ABNORMAL HIGH (ref 70–179)
Glucose, 2 hour: 121 mg/dL (ref 70–152)
Glucose, Fasting: 86 mg/dL (ref 70–91)

## 2021-03-20 LAB — PROTEIN / CREATININE RATIO, URINE
Creatinine, Urine: 67.2 mg/dL
Protein, Ur: 7.5 mg/dL
Protein/Creat Ratio: 112 mg/g creat (ref 0–200)

## 2021-03-20 LAB — HIV ANTIBODY (ROUTINE TESTING W REFLEX): HIV Screen 4th Generation wRfx: NONREACTIVE

## 2021-03-20 LAB — RPR: RPR Ser Ql: NONREACTIVE

## 2021-03-22 ENCOUNTER — Telehealth: Payer: Self-pay

## 2021-03-22 ENCOUNTER — Other Ambulatory Visit: Payer: Self-pay | Admitting: Obstetrics & Gynecology

## 2021-03-22 DIAGNOSIS — O24419 Gestational diabetes mellitus in pregnancy, unspecified control: Secondary | ICD-10-CM

## 2021-03-22 MED ORDER — ACCU-CHEK GUIDE VI STRP: ORAL_STRIP | 6 refills | Status: DC

## 2021-03-22 MED ORDER — ACCU-CHEK GUIDE W/DEVICE KIT: 1.0000 | PACK | Freq: Once | 0 refills | Status: AC

## 2021-03-22 MED ORDER — ACCU-CHEK SOFTCLIX LANCETS MISC: 6 refills | Status: DC

## 2021-03-22 NOTE — Telephone Encounter (Addendum)
Called pt with GTT results per Anyanwu MD. Pt tested positive for GDM. Results and provider recommendation reviewed. Diabetes education appt scheduled for 04/06/21 at 0915. Supplies sent to pharmacy and patient instructed to pick up prior to appt.  ?

## 2021-04-01 ENCOUNTER — Encounter: Payer: Self-pay | Admitting: Family Medicine

## 2021-04-05 ENCOUNTER — Ambulatory Visit (INDEPENDENT_AMBULATORY_CARE_PROVIDER_SITE_OTHER): Payer: Medicaid Other | Admitting: Medical

## 2021-04-05 ENCOUNTER — Other Ambulatory Visit (HOSPITAL_COMMUNITY)
Admission: RE | Admit: 2021-04-05 | Discharge: 2021-04-05 | Disposition: A | Discharge status: Home / Self Care | Payer: Medicaid Other | Source: Ambulatory Visit | Attending: Medical | Admitting: Medical

## 2021-04-05 ENCOUNTER — Encounter: Payer: Self-pay | Admitting: Medical

## 2021-04-05 VITALS — BP 139/77 | HR 84 | Wt 191.0 lb

## 2021-04-05 DIAGNOSIS — O26899 Other specified pregnancy related conditions, unspecified trimester: Secondary | ICD-10-CM | POA: Diagnosis present

## 2021-04-05 DIAGNOSIS — O24410 Gestational diabetes mellitus in pregnancy, diet controlled: Secondary | ICD-10-CM

## 2021-04-05 DIAGNOSIS — O368130 Decreased fetal movements, third trimester, not applicable or unspecified: Secondary | ICD-10-CM

## 2021-04-05 DIAGNOSIS — N898 Other specified noninflammatory disorders of vagina: Secondary | ICD-10-CM | POA: Diagnosis present

## 2021-04-05 DIAGNOSIS — O0990 Supervision of high risk pregnancy, unspecified, unspecified trimester: Secondary | ICD-10-CM

## 2021-04-05 DIAGNOSIS — G44229 Chronic tension-type headache, not intractable: Secondary | ICD-10-CM

## 2021-04-05 DIAGNOSIS — Z3A29 29 weeks gestation of pregnancy: Secondary | ICD-10-CM

## 2021-04-05 DIAGNOSIS — O10919 Unspecified pre-existing hypertension complicating pregnancy, unspecified trimester: Secondary | ICD-10-CM

## 2021-04-05 DIAGNOSIS — O09299 Supervision of pregnancy with other poor reproductive or obstetric history, unspecified trimester: Secondary | ICD-10-CM

## 2021-04-05 MED ORDER — BUTALBITAL-APAP-CAFFEINE 50-325-40 MG PO TABS: 1.0000 | ORAL_TABLET | Freq: Four times a day (QID) | ORAL | 1 refills | Status: DC | PRN

## 2021-04-05 NOTE — Progress Notes (Signed)
Patient stated "baby movements have decreased"  ?

## 2021-04-05 NOTE — Progress Notes (Signed)
? ?  PRENATAL VISIT NOTE ? ?Subjective:  ?Regina [name] is a 21 y.o. [phone] at [redacted]w[redacted]d being seen today for ongoing prenatal care.  She is currently monitored for the following issues for this high-risk pregnancy and has History of severe preeclampsia, prior pregnancy, currently pregnant; Chronic hypertension during pregnancy; Gestational diabetes mellitus (GDM), antepartum; and Supervision of high risk pregnancy, antepartum on their problem list. ? ?Patient reports decreased fetal movement x 2 weeks, mucous vaginal discharge since Friday, continued headaches unrelieved with Tylenol, Flexeril and Reglan and RUQ pain, intermittent, sharp. Patient had normal LFTs on 03/19/21. If pain continues, consider repeat at next visit.  Contractions: Not present. Vag. Bleeding: None.  Movement: (!) Decreased. Denies leaking of fluid.  ? ?The following portions of the patient's history were reviewed and updated as appropriate: allergies, current medications, past family history, past medical history, past social history, past surgical history and problem list.  ? ?Objective:  ? ?Vitals:  ? 04/05/21 1034  ?BP: 139/77  ?Pulse: 84  ?Weight: 191 lb (86.6 kg)  ? ? ?Fetal Status: Fetal Heart Rate (bpm): 150 Fundal Height: 31 cm Movement: (!) Decreased    ? ?General:  Alert, oriented and cooperative. Patient is in no acute distress.  ?Skin: Skin is warm and dry. No rash noted.   ?Cardiovascular: Normal heart rate noted  ?Respiratory: Normal respiratory effort, no problems with respiration noted  ?Abdomen: Soft, gravid, appropriate for gestational age.  Pain/Pressure: Present     ?Pelvic: Cervical exam deferred        ?Extremities: Normal range of motion.     ?Mental Status: Normal mood and affect. Normal behavior. Normal judgment and thought content.  ? ?Assessment and Plan:  ?Pregnancy: [id number] at [redacted]w[redacted]d ?1. Supervision of high risk pregnancy, antepartum ? ?2. Diet controlled gestational diabetes mellitus (GDM), antepartum ?- Diabetes  education scheduled 4/4 ?- Patient has already picked up supplies  ? ?3. History of severe preeclampsia, prior pregnancy, currently pregnant ?- Normotensive today ?- patient with continued headaches, will monitor ? ?4. Chronic hypertension during pregnancy ?- Normotensive today ?- Follow-up US 04/12/21 ? ?5. [redacted] weeks gestation of pregnancy ? ?6. Chronic tension-type headache, not intractable ?- butalbital-acetaminophen-caffeine (FIORICET) 50-325-40 MG tablet; Take 1 tablet by mouth every 6 (six) hours as needed for headache.  Dispense: 20 tablet; Refill: 1 ? ?7. Decreased fetal movements in third trimester, single or unspecified fetus ?- Fetal nonstress test ? ?Fetal Monitoring: ?Baseline: 150 bpm ?Variability: moderate ?Accelerations: 10 x 10 ?Decelerations: none ?Contractions: None ? ? ?8. Vaginal discharge during pregnancy, antepartum ?- Cervicovaginal ancillary only( [place]) ? ?Preterm labor symptoms and general obstetric precautions including but not limited to vaginal bleeding, contractions, leaking of fluid and fetal movement were reviewed in detail with the patient. ?Please refer to After Visit Summary for other counseling recommendations.  ? ?Return in about 2 weeks (around 04/19/2021) for [facility] MD only, In-Person. ? ?Future Appointments  ?Date Time Provider [place]  ?04/06/2021  9:15 AM [facility]-ED[facility]ATION [facility]  ?04/12/2021 12:30 PM [facility] NURSE [facility]  ?04/12/2021 12:45 PM [facility] US5 [facility]  ?04/23/2021 10:55 AM Aletha Halim, MD [facility]  ? ? ?Kerry Hough, PA-C ? ?

## 2021-04-05 NOTE — Progress Notes (Signed)
Subjective:  ?Jamileth Feulner Jeanbaptiste is a 21 y.o. [phone] at [redacted]w[redacted]d being seen today for ongoing prenatal care.  She is currently monitored for the following issues for this high-risk pregnancy and has History of severe preeclampsia, prior pregnancy, currently pregnant; Chronic hypertension during pregnancy; Gestational diabetes mellitus (GDM), antepartum; and Supervision of high risk pregnancy, antepartum on their problem list. ? ?Patient reports headache and decreased fetal movement .  Contractions: Not present. Vag. Bleeding: None.  Movement: (!) Decreased. Denies leaking of fluid.  ? ?The following portions of the patient's history were reviewed and updated as appropriate: allergies, current medications, past family history, past medical history, past social history, past surgical history and problem list. Problem list updated. ? ?Objective:  ? ?Vitals:  ? 04/05/21 1034  ?BP: 139/77  ?Pulse: 84  ?Weight: 191 lb (86.6 kg)  ? ? ?Fetal Status: Fetal Heart Rate (bpm): 150   Movement: (!) Decreased    ? ?General:  Alert, oriented and cooperative. Patient is in no acute distress.  ?Skin: Skin is warm and dry. No rash noted.   ?Cardiovascular: Normal heart rate noted  ?Respiratory: Normal respiratory effort, no problems with respiration noted  ?Abdomen: Soft, gravid, appropriate for gestational age. Pain/Pressure: Present     ?Pelvic: Vag. Bleeding: None     ?Cervical exam deferred        ?Extremities: Normal range of motion.     ?Mental Status: Normal mood and affect. Normal behavior. Normal judgment and thought content.  ? ?Urinalysis:     ? ?Assessment and Plan:  ?Pregnancy: [id number] at [redacted]w[redacted]d ? ?1. Supervision of high risk pregnancy, antepartum ?-NST performed today to evaluate for DFM. ? ?2. Diet controlled gestational diabetes mellitus (GDM), antepartum ?-Appointment with diabetes educator on 04/04.  ?-History of GDM in previous pregnancy ? ?3. History of severe preeclampsia, prior pregnancy, currently pregnant ?-PC ratio  WNL on 03/17. ?-Reports headaches most of this pregnancy, occurring daily. States she has floaters and blurred vision with headaches. ?-Tried Tylenol, Reglan, and Flexeril for headaches with no relief. ?-Notes increased edema in hands and lower legs for last 2 weeks. ? ?4. Chronic hypertension during pregnancy ?-BP 139/77 today. ? ?5. [redacted] weeks gestation of pregnancy ? ? ?Preterm labor symptoms and general obstetric precautions including but not limited to vaginal bleeding, contractions, leaking of fluid and fetal movement were reviewed in detail with the patient. ?Please refer to After Visit Summary for other counseling recommendations.  ?No follow-ups on file. ? ? ?Chevis Pretty, Student-PA  ?

## 2021-04-06 ENCOUNTER — Ambulatory Visit (INDEPENDENT_AMBULATORY_CARE_PROVIDER_SITE_OTHER): Payer: Medicaid Other | Admitting: Registered"

## 2021-04-06 ENCOUNTER — Encounter: Payer: Medicaid Other | Attending: Obstetrics & Gynecology | Admitting: Registered"

## 2021-04-06 DIAGNOSIS — Z3A00 Weeks of gestation of pregnancy not specified: Secondary | ICD-10-CM | POA: Insufficient documentation

## 2021-04-06 DIAGNOSIS — O24419 Gestational diabetes mellitus in pregnancy, unspecified control: Secondary | ICD-10-CM | POA: Insufficient documentation

## 2021-04-06 DIAGNOSIS — O24410 Gestational diabetes mellitus in pregnancy, diet controlled: Secondary | ICD-10-CM

## 2021-04-06 LAB — CERVICOVAGINAL ANCILLARY ONLY
Bacterial Vaginitis (gardnerella): NEGATIVE
Candida Glabrata: NEGATIVE
Candida Vaginitis: NEGATIVE
Chlamydia: NEGATIVE
Comment: NEGATIVE
Comment: NEGATIVE
Comment: NEGATIVE
Comment: NEGATIVE
Comment: NEGATIVE
Comment: NORMAL
Neisseria Gonorrhea: NEGATIVE
Trichomonas: NEGATIVE

## 2021-04-06 NOTE — Progress Notes (Signed)
Patient was seen for Gestational Diabetes self-management on 04/06/21  ?Start time [phone] and End time 1006  ? ?Estimated due date: 06/17/21; [redacted]w[redacted]d ? ?Clinical: ?Medications: reviewed ?Medical History: GDM in 2021 ?Labs: OGTT 1 hr 192, A1c 5.2%  ? ?Dietary and Lifestyle History: ?Pt reports GDM in last pregnancy. Pt states blood sugar was high but didn't start medications due to late dx of GDM and was induced soon after.  ? ?Pt states her activity consists of taking care of the house and her 2 children. Pt states she usually sleeps until 11 or 12.  ? ?Physical Activity: ADL ?Stress: not assessed ?Sleep: not assessed ? ?24 hr Recall:  ?First Meal: rice, chicken or egg, vegetables ?Snacks: fruit, chips ?Third meal: same as first ?Snack: ?Beverages: water, coconut water straight from the coconut ? ?NUTRITION INTERVENTION  ?Nutrition education (E-1) on the following topics:  ? ?Initial Follow-up ? ?[x]  []  Definition of Gestational Diabetes ?[x]  []  Why dietary management is important in controlling blood glucose ?[x]  []  Effects each nutrient has on blood glucose levels ?[x]  []  Simple carbohydrates vs complex carbohydrates ?[]  []  Fluid intake ?[x]  []  Creating a balanced meal plan ?[x]  []  Carbohydrate counting  ?[x]  []  When to check blood glucose levels ?[x]  []  Proper blood glucose monitoring techniques ?[x]  []  Effect of stress and stress reduction techniques  ?[x]  []  Exercise effect on blood glucose levels, appropriate exercise during pregnancy ?[x]  []  Importance of limiting caffeine and abstaining from alcohol and smoking ?[x]  []  Medications used for blood sugar control during pregnancy ?[x]  []  Hypoglycemia and rule of 15 ?[x]  []  Postpartum self care ? ?Patient brought meter to visit for training.  ?FBS: 93 mg/dL ? ?Patient instructed to monitor glucose levels: ?FBS: 60 - ? 95 mg/dL (some clinics use 90 for cutoff) ?1 hour: ? 140 mg/dL ?2 hour: ? 120 mg/dL ? ?Patient received handouts: ?Nutrition Diabetes and  Pregnancy ?Carbohydrate Counting List ? ?Patient will be seen for follow-up as needed.  ?

## 2021-04-07 ENCOUNTER — Ambulatory Visit: Payer: Medicaid Other

## 2021-04-12 ENCOUNTER — Other Ambulatory Visit: Payer: Self-pay | Admitting: *Deleted

## 2021-04-12 ENCOUNTER — Ambulatory Visit: Payer: Medicaid Other | Admitting: *Deleted

## 2021-04-12 ENCOUNTER — Ambulatory Visit: Payer: Medicaid Other | Attending: Obstetrics and Gynecology

## 2021-04-12 ENCOUNTER — Ambulatory Visit: Payer: Medicaid Other | Attending: Obstetrics and Gynecology | Admitting: Obstetrics and Gynecology

## 2021-04-12 VITALS — BP 141/79 | HR 83

## 2021-04-12 DIAGNOSIS — O10013 Pre-existing essential hypertension complicating pregnancy, third trimester: Secondary | ICD-10-CM

## 2021-04-12 DIAGNOSIS — O24419 Gestational diabetes mellitus in pregnancy, unspecified control: Secondary | ICD-10-CM

## 2021-04-12 DIAGNOSIS — E669 Obesity, unspecified: Secondary | ICD-10-CM | POA: Diagnosis not present

## 2021-04-12 DIAGNOSIS — O24410 Gestational diabetes mellitus in pregnancy, diet controlled: Secondary | ICD-10-CM

## 2021-04-12 DIAGNOSIS — Z3A30 30 weeks gestation of pregnancy: Secondary | ICD-10-CM | POA: Diagnosis not present

## 2021-04-12 DIAGNOSIS — O10912 Unspecified pre-existing hypertension complicating pregnancy, second trimester: Secondary | ICD-10-CM | POA: Insufficient documentation

## 2021-04-12 DIAGNOSIS — O10919 Unspecified pre-existing hypertension complicating pregnancy, unspecified trimester: Secondary | ICD-10-CM

## 2021-04-12 DIAGNOSIS — O99212 Obesity complicating pregnancy, second trimester: Secondary | ICD-10-CM

## 2021-04-12 DIAGNOSIS — O99213 Obesity complicating pregnancy, third trimester: Secondary | ICD-10-CM

## 2021-04-12 DIAGNOSIS — O09213 Supervision of pregnancy with history of pre-term labor, third trimester: Secondary | ICD-10-CM

## 2021-04-12 DIAGNOSIS — O09292 Supervision of pregnancy with other poor reproductive or obstetric history, second trimester: Secondary | ICD-10-CM | POA: Insufficient documentation

## 2021-04-12 DIAGNOSIS — O0990 Supervision of high risk pregnancy, unspecified, unspecified trimester: Secondary | ICD-10-CM | POA: Diagnosis present

## 2021-04-12 DIAGNOSIS — Z6832 Body mass index (BMI) 32.0-32.9, adult: Secondary | ICD-10-CM

## 2021-04-12 DIAGNOSIS — O09293 Supervision of pregnancy with other poor reproductive or obstetric history, third trimester: Secondary | ICD-10-CM

## 2021-04-12 NOTE — Progress Notes (Signed)
Maternal-Fetal Medicine  ? ?Name: Regina [name] ?DOB: Jan 11, 2000 ?MRN: [id number] ?Referring Provider: Vonzella Nipple, PA-C ? ?I had the pleasure of seeing [name] today at the [facility]. G3 P1102 at 30w 4d gestation and has a recent diagnosis of gestational diabetes. Patient had consultation with diabetic educator, and reports that her diabetes is well controlled on diet.  She is unclear about her fasting and postprandial levels. ?She has chronic hypertension and is not taking antihypertensives.  But her blood pressure today at her office is 141/79 mmHg.  She has intermittent headaches and ?spots before eyes.?  No visual disturbances or right upper quadrant pain or vaginal bleeding. ? ?Ultrasound ?Fetal growth is appropriate for gestational age.  Amniotic fluid is normal and good fetal activity seen.  Breech presentation. ? ?Gestational diabetes ?I explained the diagnosis of gestational diabetes.  I emphasized the importance of good blood glucose control to prevent adverse fetal or neonatal outcomes.  I discussed blood glucose normal values. I encouraged her to check her blood glucose regularly. ?Possible complications of gestational diabetes include fetal macrosomia, shoulder dystocia and birth injuries, stillbirth (in poorly controlled diabetes) and neonatal respiratory syndrome and other complications. ? ?In about 85% of cases, gestational diabetes is well controlled by diet alone.  Exercise reduces the need for insulin.  Medical treatment includes oral hypoglycemics or insulin. ? ?Timing of delivery: In well-controlled diabetes on diet, patient can be delivered at 77- or 40-weeks' gestation. Vaginal delivery is not contraindicated. ?Type 2 diabetes develops in up to 50% of women with GDM. I recommend postpartum screening with 75-g glucose load at 6 to 12 weeks after delivery. ? ?Chronic hypertension ?I discussed the symptoms and superimposed preeclampsia that is more common in patients with  chronic hypertension.  I offered MAU evaluation.  Patient opted not to go to the hospital.  I educated her on severe symptoms of headaches or visual disturbances or right upper quadrant pain or vaginal bleeding that should prompt her to go to the hospital. ?Recommendations ?-An appointment was made for her to return in 3 weeks for BPP and then weekly till delivery. ? ?Thank you for consultation.  If you have any questions or concerns, please contact me the [facility].  Consultation including face-to-face (more than 50%) counseling 30 minutes. ? ?

## 2021-04-23 ENCOUNTER — Ambulatory Visit (INDEPENDENT_AMBULATORY_CARE_PROVIDER_SITE_OTHER): Payer: Medicaid Other | Admitting: Obstetrics and Gynecology

## 2021-04-23 VITALS — BP 134/82 | HR 88 | Wt 192.6 lb

## 2021-04-23 DIAGNOSIS — Z3A32 32 weeks gestation of pregnancy: Secondary | ICD-10-CM | POA: Diagnosis not present

## 2021-04-23 DIAGNOSIS — O10919 Unspecified pre-existing hypertension complicating pregnancy, unspecified trimester: Secondary | ICD-10-CM

## 2021-04-23 DIAGNOSIS — O24419 Gestational diabetes mellitus in pregnancy, unspecified control: Secondary | ICD-10-CM | POA: Diagnosis not present

## 2021-04-23 DIAGNOSIS — O09299 Supervision of pregnancy with other poor reproductive or obstetric history, unspecified trimester: Secondary | ICD-10-CM | POA: Diagnosis not present

## 2021-04-23 DIAGNOSIS — M549 Dorsalgia, unspecified: Secondary | ICD-10-CM

## 2021-04-23 LAB — GLUCOSE, CAPILLARY: Glucose-Capillary: 174 mg/dL — ABNORMAL HIGH (ref 70–99)

## 2021-04-23 MED ORDER — COMFORT FIT MATERNITY SUPP MED MISC: 1.0000 [IU] | 0 refills | Status: DC | PRN

## 2021-04-23 MED ORDER — MAGNESIUM 400 MG PO TABS: 1.0000 | ORAL_TABLET | Freq: Every day | ORAL | 2 refills | Status: DC

## 2021-04-23 NOTE — Progress Notes (Signed)
? ? ?PRENATAL VISIT NOTE ? ?Subjective:  ?Regina [name] is a 21 y.o. [phone] at [redacted]w[redacted]d being seen today for ongoing prenatal care.  She is currently monitored for the following issues for this high-risk pregnancy and has History of severe preeclampsia, prior pregnancy, currently pregnant; Chronic hypertension during pregnancy; Gestational diabetes mellitus (GDM), antepartum; and Supervision of high risk pregnancy, antepartum on their problem list. ? ?Patient reports HA in pregnancy. She states she only gets them in pregnancy and cant think of anything that helps it or alleviates it; none currently and no visual s/s ? ?Right upper back to side pain that comes and goes. Doesn't worsen with food. No GERD s/s.  Contractions: Not present. Vag. Bleeding: None.  Movement: Present. Denies leaking of fluid.  ? ?The following portions of the patient's history were reviewed and updated as appropriate: allergies, current medications, past family history, past medical history, past social history, past surgical history and problem list.  ? ?Objective:  ? ?Vitals:  ? 04/23/21 1046  ?BP: 134/82  ?Pulse: 88  ?Weight: 192 lb 9.6 oz (87.4 kg)  ? ? ?Fetal Status: Fetal Heart Rate (bpm): 142   Movement: Present    ? ?General:  Alert, oriented and cooperative. Patient is in no acute distress.  ?Skin: Skin is warm and dry. No rash noted.   ?Cardiovascular: Normal heart rate noted  ?Respiratory: Normal respiratory effort, no problems with respiration noted  ?Abdomen: Soft, gravid, appropriate for gestational age.  Pain/Pressure: Present     ?Pelvic: Cervical exam deferred        ?Extremities: Normal range of motion.  Edema: None  ?Mental Status: Normal mood and affect. Normal behavior. Normal judgment and thought content.  ? ?Assessment and Plan:  ?Pregnancy: [id number] at [redacted]w[redacted]d ?1. [redacted] weeks gestation of pregnancy ?Patient thinks she's tried Mg before. She doesn't consume caffeine. I told her I recommend doing Mg qday; pt not sure if she has  any pills left so Rx sent in ?Doesn't sound GI related but will get ruq u/s and labs ?- US Abdomen Limited RUQ (LIVER/GB); Future ?- CBC ?- Comprehensive metabolic panel ?- Lipase ?- Amylase ? ?2. Gestational diabetes mellitus (GDM), antepartum, gestational diabetes method of control unspecified ?Patient had GDM education visit on 4/4 but she is not checking her sugars; she didn't have GDM in prior pregnancies. No values to review or on recall. Random checked today and it is 174, and she states she ate chicken and white rice an hour before. She says that she forgets to check her sugars. Importance of checking CBGs d/w her for maternal and fetal health and detrimental effects of poor control like LGA, FGR, fetal distress, IUFD. ?Log given to patient and d/w her on how to check and request made to see DM education again in two weeks and for her to come back and see OB in a week to review sugars.  ?Patient already set up to start qwk testing at 34wks ?4/10: 31%, 1560gm, ac 33%, afi 6.47 ? ?3. History of severe preeclampsia, prior pregnancy, currently pregnant ? ?4. Chronic hypertension during pregnancy ?On no meds; see above ?- CBC ?- Comprehensive metabolic panel ?- Lipase ?- Amylase ? ?5. Right-sided back pain, unspecified back location, unspecified chronicity ?See above ?- US Abdomen Limited RUQ (LIVER/GB); Future ?- CBC ?- Comprehensive metabolic panel ?- Lipase ?- Amylase ?- Elastic Bandages & Supports (COMFORT FIT MATERNITY SUPP MED) MISC; 1 Units by Does not apply route as needed.  Dispense: 1 each;  Refill: 0 ? ?Preterm labor symptoms and general obstetric precautions including but not limited to vaginal bleeding, contractions, leaking of fluid and fetal movement were reviewed in detail with the patient. ?Please refer to After Visit Summary for other counseling recommendations.  ? ?Return in about 1 week (around 04/30/2021) for in person, high risk ob, md visit. ? ?Future Appointments  ?Date Time Provider  [place]  ?04/29/2021  8:30 AM WL-US 2 WL-US [place]  ?04/29/2021  1:35 PM Constant, Peggy, MD [facility]  ?05/04/2021 10:15 AM [facility]-MFC NURSE [facility]  ?05/04/2021 10:30 AM WMC-MFC US2 [facility]-M[facility]US [facility]  ?05/06/2021  9:15 AM WMC-EDUCATION [facility]  ?05/10/2021 10:15 AM [facility]-MFC NURSE [facility]  ?05/10/2021 10:30 AM WMC-MFC US2 WMC-MFCUS [place]  ?05/11/2021  8:35 AM Hillard Danker Myles Rosenthal, PA-C [facility]  ?05/17/2021 10:15 AM WMC-MFC NURSE [facility]  ?05/17/2021 10:30 AM WMC-MFC US2 [facility]-MFCUS [facility]  ?05/20/2021  9:15 AM Aletha Halim, MD [facility]  ?05/24/2021 10:15 AM W[facility]-MFC NURSE [facility]  ?05/24/2021 10:30 AM WMC-MFC US2 [facility]-MFCUS [facility]  ? ? ?Aletha Halim, MD  ?

## 2021-04-23 NOTE — Progress Notes (Signed)
BG 40 minutes post meal 174. ?

## 2021-04-24 LAB — CBC
Hematocrit: 38.7 % (ref 34.0–46.6)
Hemoglobin: 13.2 g/dL (ref 11.1–15.9)
MCH: 31.4 pg (ref 26.6–33.0)
MCHC: 34.1 g/dL (ref 31.5–35.7)
MCV: 92 fL (ref 79–97)
Platelets: 203 10*3/uL (ref 150–450)
RBC: 4.2 x10E6/uL (ref 3.77–5.28)
RDW: 12.8 % (ref 11.7–15.4)
WBC: 8.8 10*3/uL (ref 3.4–10.8)

## 2021-04-24 LAB — COMPREHENSIVE METABOLIC PANEL
ALT: 10 IU/L (ref 0–32)
AST: 15 IU/L (ref 0–40)
Albumin/Globulin Ratio: 1.2 (ref 1.2–2.2)
Albumin: 3.6 g/dL — ABNORMAL LOW (ref 3.9–5.0)
Alkaline Phosphatase: 66 IU/L (ref 44–121)
BUN/Creatinine Ratio: 6 — ABNORMAL LOW (ref 9–23)
BUN: 3 mg/dL — ABNORMAL LOW (ref 6–20)
Bilirubin Total: <0.2 mg/dL (ref 0.0–1.2)
CO2: 20 mmol/L (ref 20–29)
Calcium: 8.7 mg/dL (ref 8.7–10.2)
Chloride: 101 mmol/L (ref 96–106)
Creatinine, Ser: 0.49 mg/dL — ABNORMAL LOW (ref 0.57–1.00)
Globulin, Total: 2.9 g/dL (ref 1.5–4.5)
Glucose: 106 mg/dL — ABNORMAL HIGH (ref 70–99)
Potassium: 3.4 mmol/L — ABNORMAL LOW (ref 3.5–5.2)
Sodium: 135 mmol/L (ref 134–144)
Total Protein: 6.5 g/dL (ref 6.0–8.5)
eGFR: 137 mL/min/{1.73_m2} (ref >59)

## 2021-04-24 LAB — AMYLASE: Amylase: 77 U/L (ref 31–110)

## 2021-04-24 LAB — LIPASE: Lipase: 41 U/L (ref 14–72)

## 2021-04-25 ENCOUNTER — Encounter: Payer: Self-pay | Admitting: Obstetrics and Gynecology

## 2021-04-29 ENCOUNTER — Encounter: Payer: Medicaid Other | Admitting: Obstetrics and Gynecology

## 2021-04-29 ENCOUNTER — Ambulatory Visit (HOSPITAL_COMMUNITY)
Admission: RE | Admit: 2021-04-29 | Discharge: 2021-04-29 | Disposition: A | Discharge status: Home / Self Care | Payer: Medicaid Other | Source: Ambulatory Visit | Attending: Obstetrics and Gynecology | Admitting: Obstetrics and Gynecology

## 2021-04-29 DIAGNOSIS — O26893 Other specified pregnancy related conditions, third trimester: Secondary | ICD-10-CM | POA: Insufficient documentation

## 2021-04-29 DIAGNOSIS — Z3A32 32 weeks gestation of pregnancy: Secondary | ICD-10-CM | POA: Insufficient documentation

## 2021-04-29 DIAGNOSIS — J90 Pleural effusion, not elsewhere classified: Secondary | ICD-10-CM | POA: Insufficient documentation

## 2021-04-29 DIAGNOSIS — M549 Dorsalgia, unspecified: Secondary | ICD-10-CM | POA: Diagnosis present

## 2021-05-03 ENCOUNTER — Encounter: Payer: Self-pay | Admitting: Obstetrics and Gynecology

## 2021-05-03 ENCOUNTER — Ambulatory Visit (INDEPENDENT_AMBULATORY_CARE_PROVIDER_SITE_OTHER): Payer: Medicaid Other | Admitting: Obstetrics and Gynecology

## 2021-05-03 VITALS — BP 126/84 | HR 106 | Wt 194.3 lb

## 2021-05-03 DIAGNOSIS — O09299 Supervision of pregnancy with other poor reproductive or obstetric history, unspecified trimester: Secondary | ICD-10-CM

## 2021-05-03 DIAGNOSIS — O24410 Gestational diabetes mellitus in pregnancy, diet controlled: Secondary | ICD-10-CM

## 2021-05-03 DIAGNOSIS — O0990 Supervision of high risk pregnancy, unspecified, unspecified trimester: Secondary | ICD-10-CM

## 2021-05-03 DIAGNOSIS — O10919 Unspecified pre-existing hypertension complicating pregnancy, unspecified trimester: Secondary | ICD-10-CM

## 2021-05-03 NOTE — Patient Instructions (Signed)

## 2021-05-03 NOTE — Progress Notes (Signed)
Subjective:  ?Regina [name] is a 21 y.o. [phone] at 103w4d being seen today for ongoing prenatal care.  She is currently monitored for the following issues for this high-risk pregnancy and has History of severe preeclampsia, prior pregnancy, currently pregnant; Chronic hypertension during pregnancy; Gestational diabetes mellitus (GDM), antepartum; and Supervision of high risk pregnancy, antepartum on their problem list. ? ?Patient reports general discomforts of pregnancy.  Contractions: Irritability. Vag. Bleeding: None.  Movement: Present. Denies leaking of fluid.  ? ?The following portions of the patient's history were reviewed and updated as appropriate: allergies, current medications, past family history, past medical history, past social history, past surgical history and problem list. Problem list updated. ? ?Objective:  ? ?Vitals:  ? 05/03/21 0826  ?BP: 126/84  ?Pulse: (!) 106  ?Weight: 194 lb 4.8 oz (88.1 kg)  ? ? ?Fetal Status: Fetal Heart Rate (bpm): 152   Movement: Present    ? ?General:  Alert, oriented and cooperative. Patient is in no acute distress.  ?Skin: Skin is warm and dry. No rash noted.   ?Cardiovascular: Normal heart rate noted  ?Respiratory: Normal respiratory effort, no problems with respiration noted  ?Abdomen: Soft, gravid, appropriate for gestational age. Pain/Pressure: Present     ?Pelvic:  Cervical exam deferred        ?Extremities: Normal range of motion.  Edema: Trace  ?Mental Status: Normal mood and affect. Normal behavior. Normal judgment and thought content.  ? ?Urinalysis:     ? ?Assessment and Plan:  ?Pregnancy: [id number] at [redacted]w[redacted]d ? ?1. Supervision of high risk pregnancy, antepartum ?Stable ? ?2. Diet controlled gestational diabetes mellitus (GDM), antepartum ?CBG's in goal range for the most part, but only checking fasting due to having 2 other children. ?Importance of check qid reviewed with pt ?Serial growth scans and antenatal testing as per MFM ? ?3. Chronic hypertension during  pregnancy ?BP stable without meds ?Continue with qd BASA ?U/S's as noted above ? ?4. History of severe preeclampsia, prior pregnancy, currently pregnant ?No S/Sx at present ? ?Preterm labor symptoms and general obstetric precautions including but not limited to vaginal bleeding, contractions, leaking of fluid and fetal movement were reviewed in detail with the patient. ?Please refer to After Visit Summary for other counseling recommendations.  ?Return in about 1 week (around 05/10/2021) for OB visit, face to face, MD only. ? ? ?Chancy Milroy, MD ?

## 2021-05-03 NOTE — Progress Notes (Signed)
Pt has Glucose numbers on her Phone.Pt states that she has a sharpe pain on left side that radiates down her leg. ?

## 2021-05-04 ENCOUNTER — Ambulatory Visit: Payer: Medicaid Other | Admitting: *Deleted

## 2021-05-04 ENCOUNTER — Ambulatory Visit: Payer: Medicaid Other | Attending: Obstetrics and Gynecology

## 2021-05-04 ENCOUNTER — Encounter: Payer: Self-pay | Admitting: *Deleted

## 2021-05-04 VITALS — BP 164/98 | HR 87

## 2021-05-04 VITALS — BP 153/87 | HR 88

## 2021-05-04 DIAGNOSIS — Z6832 Body mass index (BMI) 32.0-32.9, adult: Secondary | ICD-10-CM | POA: Insufficient documentation

## 2021-05-04 DIAGNOSIS — O24419 Gestational diabetes mellitus in pregnancy, unspecified control: Secondary | ICD-10-CM | POA: Diagnosis present

## 2021-05-04 DIAGNOSIS — O0990 Supervision of high risk pregnancy, unspecified, unspecified trimester: Secondary | ICD-10-CM

## 2021-05-04 DIAGNOSIS — O10919 Unspecified pre-existing hypertension complicating pregnancy, unspecified trimester: Secondary | ICD-10-CM | POA: Insufficient documentation

## 2021-05-04 DIAGNOSIS — O09293 Supervision of pregnancy with other poor reproductive or obstetric history, third trimester: Secondary | ICD-10-CM

## 2021-05-04 DIAGNOSIS — Z3A33 33 weeks gestation of pregnancy: Secondary | ICD-10-CM

## 2021-05-04 DIAGNOSIS — O24410 Gestational diabetes mellitus in pregnancy, diet controlled: Secondary | ICD-10-CM

## 2021-05-04 DIAGNOSIS — O99213 Obesity complicating pregnancy, third trimester: Secondary | ICD-10-CM

## 2021-05-04 DIAGNOSIS — O09213 Supervision of pregnancy with history of pre-term labor, third trimester: Secondary | ICD-10-CM

## 2021-05-04 DIAGNOSIS — O10013 Pre-existing essential hypertension complicating pregnancy, third trimester: Secondary | ICD-10-CM | POA: Diagnosis not present

## 2021-05-04 DIAGNOSIS — E669 Obesity, unspecified: Secondary | ICD-10-CM

## 2021-05-04 NOTE — Progress Notes (Signed)
Dr. Annamaria Boots notified of bp of 164/98 and instructed pt to continue to be aware of H/A, RUQ pain, visual disturbances, swelling in face hands or feet, vag bleeding  or decreased fetal movement.  Reflexes +1. No orders received at this time. ?

## 2021-05-06 ENCOUNTER — Other Ambulatory Visit: Payer: Medicaid Other

## 2021-05-10 ENCOUNTER — Encounter: Payer: Self-pay | Admitting: *Deleted

## 2021-05-10 ENCOUNTER — Ambulatory Visit: Payer: Medicaid Other | Admitting: *Deleted

## 2021-05-10 ENCOUNTER — Ambulatory Visit: Payer: Medicaid Other | Attending: Obstetrics and Gynecology

## 2021-05-10 VITALS — BP 144/89 | HR 95

## 2021-05-10 DIAGNOSIS — O24410 Gestational diabetes mellitus in pregnancy, diet controlled: Secondary | ICD-10-CM

## 2021-05-10 DIAGNOSIS — O0990 Supervision of high risk pregnancy, unspecified, unspecified trimester: Secondary | ICD-10-CM | POA: Diagnosis present

## 2021-05-10 DIAGNOSIS — O10919 Unspecified pre-existing hypertension complicating pregnancy, unspecified trimester: Secondary | ICD-10-CM | POA: Insufficient documentation

## 2021-05-10 DIAGNOSIS — O99213 Obesity complicating pregnancy, third trimester: Secondary | ICD-10-CM

## 2021-05-10 DIAGNOSIS — Z6832 Body mass index (BMI) 32.0-32.9, adult: Secondary | ICD-10-CM | POA: Diagnosis present

## 2021-05-10 DIAGNOSIS — O24419 Gestational diabetes mellitus in pregnancy, unspecified control: Secondary | ICD-10-CM | POA: Insufficient documentation

## 2021-05-10 DIAGNOSIS — O10013 Pre-existing essential hypertension complicating pregnancy, third trimester: Secondary | ICD-10-CM

## 2021-05-10 DIAGNOSIS — O09213 Supervision of pregnancy with history of pre-term labor, third trimester: Secondary | ICD-10-CM

## 2021-05-10 DIAGNOSIS — E669 Obesity, unspecified: Secondary | ICD-10-CM

## 2021-05-10 DIAGNOSIS — Z3A34 34 weeks gestation of pregnancy: Secondary | ICD-10-CM

## 2021-05-10 DIAGNOSIS — O09293 Supervision of pregnancy with other poor reproductive or obstetric history, third trimester: Secondary | ICD-10-CM

## 2021-05-11 ENCOUNTER — Ambulatory Visit (INDEPENDENT_AMBULATORY_CARE_PROVIDER_SITE_OTHER): Payer: Medicaid Other | Admitting: Medical

## 2021-05-11 ENCOUNTER — Encounter: Payer: Self-pay | Admitting: Medical

## 2021-05-11 VITALS — BP 147/87 | HR 90 | Wt 196.0 lb

## 2021-05-11 DIAGNOSIS — O0990 Supervision of high risk pregnancy, unspecified, unspecified trimester: Secondary | ICD-10-CM

## 2021-05-11 DIAGNOSIS — O09299 Supervision of pregnancy with other poor reproductive or obstetric history, unspecified trimester: Secondary | ICD-10-CM

## 2021-05-11 DIAGNOSIS — O24410 Gestational diabetes mellitus in pregnancy, diet controlled: Secondary | ICD-10-CM

## 2021-05-11 DIAGNOSIS — O321XX0 Maternal care for breech presentation, not applicable or unspecified: Secondary | ICD-10-CM

## 2021-05-11 DIAGNOSIS — O10919 Unspecified pre-existing hypertension complicating pregnancy, unspecified trimester: Secondary | ICD-10-CM

## 2021-05-11 DIAGNOSIS — Z3A34 34 weeks gestation of pregnancy: Secondary | ICD-10-CM

## 2021-05-11 MED ORDER — NIFEDIPINE ER OSMOTIC RELEASE 30 MG PO TB24: 30.0000 mg | ORAL_TABLET | Freq: Every day | ORAL | 3 refills | Status: DC

## 2021-05-11 NOTE — Progress Notes (Signed)
? ?  PRENATAL VISIT NOTE ? ?Subjective:  ?Regina [name] is a 21 y.o. [phone] at 73w5dbeing seen today for ongoing prenatal care.  She is currently monitored for the following issues for this high-risk pregnancy and has History of severe preeclampsia, prior pregnancy, currently pregnant; Chronic hypertension during pregnancy; Gestational diabetes mellitus (GDM), antepartum; and Supervision of high risk pregnancy, antepartum on their problem list. ? ?Patient reports headache.  Contractions: Not present. Vag. Bleeding: None.  Movement: Present. Denies leaking of fluid.  ? ?The following portions of the patient's history were reviewed and updated as appropriate: allergies, current medications, past family history, past medical history, past social history, past surgical history and problem list.  ? ?Objective:  ? ?Vitals:  ? 05/11/21 0615305/09/23 07943 ?BP: (!) 144/85 (!) 147/87  ?Pulse: 93 90  ?Weight: 196 lb (88.9 kg)   ? ? ?Fetal Status: Fetal Heart Rate (bpm): 143   Movement: Present    ? ?General:  Alert, oriented and cooperative. Patient is in no acute distress.  ?Skin: Skin is warm and dry. No rash noted.   ?Cardiovascular: Normal heart rate noted  ?Respiratory: Normal respiratory effort, no problems with respiration noted  ?Abdomen: Soft, gravid, appropriate for gestational age.  Pain/Pressure: Absent     ?Pelvic: Cervical exam deferred        ?Extremities: Normal range of motion.     ?Mental Status: Normal mood and affect. Normal behavior. Normal judgment and thought content.  ? ?Assessment and Plan:  ?Pregnancy: [id number] 392w5d1. Supervision of high risk pregnancy, antepartum ? ?2. Diet controlled gestational diabetes mellitus (GDM), antepartum ?- Patient does not have log, admits that she is rarely checking her CBGs ?- Discussed with Dr. EcDione Ploverreinforced recommended CBGs QID and check labs today  ?- HgB A1c ? ?3. History of severe preeclampsia, prior pregnancy, currently pregnant ?- CBC ?- Comp Met  (CMET) ? ?4. Chronic hypertension during pregnancy ?- BP is mildly elevated today, patient has had HA for weeks unrelieved with Tylenol ?- NIFEdipine (PROCARDIA XL) 30 MG 24 hr tablet; Take 1 tablet (30 mg total) by mouth daily.  Dispense: 30 tablet; Refill: 3 ?- CBC ?- Comp Met (CMET) ? ?5. Breech presentation ?- Will recheck at next [place] week and decide on MOD plan  ? ?6. [redacted] weeks gestation of pregnancy ? ?Preterm labor symptoms and general obstetric precautions including but not limited to vaginal bleeding, contractions, leaking of fluid and fetal movement were reviewed in detail with the patient. ?Please refer to After Visit Summary for other counseling recommendations.  ? ?Return in about 2 weeks (around 05/25/2021) [facility]. ? ?Future Appointments  ?Date Time Provider [place]?05/17/2021 10:15 AM W[facility]-MFC NURSE [facility]  ?05/17/2021 10:30 AM WMC-MFC US2 [facility]-M[facility]US [facility]  ?05/24/2021 10:15 AM W[facility]-MFC NURSE [facility]  ?05/24/2021 10:30 AM WMC-MFC US2 [facility]-M[facility]US [facility]  ?05/25/2021 10:15 AM AlGenia DelMD [facility]?06/02/2021  3:35 PM ArWoodroe ModeMD [facility]? ? ?JuKerry HoughPA-C ? ?

## 2021-05-11 NOTE — Progress Notes (Signed)
Patient stated that prior to this morning the last time she checked her sugar was "2 or 3 weeks ago"  ? ?Patient also report "daily" long lasting headaches along with blurry vision and dizzy spells  ?

## 2021-05-12 LAB — COMPREHENSIVE METABOLIC PANEL
ALT: 14 IU/L (ref 0–32)
AST: 21 IU/L (ref 0–40)
Albumin/Globulin Ratio: 1.1 — ABNORMAL LOW (ref 1.2–2.2)
Albumin: 3.5 g/dL — ABNORMAL LOW (ref 3.9–5.0)
Alkaline Phosphatase: 77 IU/L (ref 44–121)
BUN/Creatinine Ratio: 7 — ABNORMAL LOW (ref 9–23)
BUN: 4 mg/dL — ABNORMAL LOW (ref 6–20)
Bilirubin Total: <0.2 mg/dL (ref 0.0–1.2)
CO2: 19 mmol/L — ABNORMAL LOW (ref 20–29)
Calcium: 8.9 mg/dL (ref 8.7–10.2)
Chloride: 102 mmol/L (ref 96–106)
Creatinine, Ser: 0.54 mg/dL — ABNORMAL LOW (ref 0.57–1.00)
Globulin, Total: 3.1 g/dL (ref 1.5–4.5)
Glucose: 136 mg/dL — ABNORMAL HIGH (ref 70–99)
Potassium: 3.2 mmol/L — ABNORMAL LOW (ref 3.5–5.2)
Sodium: 136 mmol/L (ref 134–144)
Total Protein: 6.6 g/dL (ref 6.0–8.5)
eGFR: 134 mL/min/{1.73_m2} (ref >59)

## 2021-05-12 LAB — HEMOGLOBIN A1C
Est. average glucose Bld gHb Est-mCnc: 105 mg/dL
Hgb A1c MFr Bld: 5.3 % (ref 4.8–5.6)

## 2021-05-12 LAB — CBC
Hematocrit: 38.1 % (ref 34.0–46.6)
Hemoglobin: 13.3 g/dL (ref 11.1–15.9)
MCH: 32 pg (ref 26.6–33.0)
MCHC: 34.9 g/dL (ref 31.5–35.7)
MCV: 92 fL (ref 79–97)
Platelets: 202 10*3/uL (ref 150–450)
RBC: 4.16 x10E6/uL (ref 3.77–5.28)
RDW: 13.1 % (ref 11.7–15.4)
WBC: 8.5 10*3/uL (ref 3.4–10.8)

## 2021-05-17 ENCOUNTER — Encounter: Payer: Self-pay | Admitting: *Deleted

## 2021-05-17 ENCOUNTER — Ambulatory Visit: Payer: Medicaid Other | Admitting: *Deleted

## 2021-05-17 ENCOUNTER — Ambulatory Visit: Payer: Medicaid Other | Attending: Obstetrics and Gynecology

## 2021-05-17 ENCOUNTER — Other Ambulatory Visit: Payer: Self-pay | Admitting: *Deleted

## 2021-05-17 VITALS — BP 149/91 | HR 90

## 2021-05-17 DIAGNOSIS — O24419 Gestational diabetes mellitus in pregnancy, unspecified control: Secondary | ICD-10-CM | POA: Diagnosis not present

## 2021-05-17 DIAGNOSIS — O0990 Supervision of high risk pregnancy, unspecified, unspecified trimester: Secondary | ICD-10-CM | POA: Insufficient documentation

## 2021-05-17 DIAGNOSIS — O10013 Pre-existing essential hypertension complicating pregnancy, third trimester: Secondary | ICD-10-CM | POA: Diagnosis not present

## 2021-05-17 DIAGNOSIS — O99213 Obesity complicating pregnancy, third trimester: Secondary | ICD-10-CM

## 2021-05-17 DIAGNOSIS — O24410 Gestational diabetes mellitus in pregnancy, diet controlled: Secondary | ICD-10-CM

## 2021-05-17 DIAGNOSIS — O09293 Supervision of pregnancy with other poor reproductive or obstetric history, third trimester: Secondary | ICD-10-CM | POA: Diagnosis not present

## 2021-05-17 DIAGNOSIS — O10919 Unspecified pre-existing hypertension complicating pregnancy, unspecified trimester: Secondary | ICD-10-CM

## 2021-05-17 DIAGNOSIS — Z6832 Body mass index (BMI) 32.0-32.9, adult: Secondary | ICD-10-CM

## 2021-05-17 DIAGNOSIS — Z3A35 35 weeks gestation of pregnancy: Secondary | ICD-10-CM

## 2021-05-17 DIAGNOSIS — O09213 Supervision of pregnancy with history of pre-term labor, third trimester: Secondary | ICD-10-CM

## 2021-05-20 ENCOUNTER — Encounter: Payer: Medicaid Other | Admitting: Obstetrics and Gynecology

## 2021-05-24 ENCOUNTER — Encounter: Payer: Medicaid Other | Admitting: Advanced Practice Midwife

## 2021-05-24 ENCOUNTER — Ambulatory Visit: Payer: Medicaid Other | Attending: Obstetrics and Gynecology

## 2021-05-24 ENCOUNTER — Ambulatory Visit: Payer: Medicaid Other | Admitting: *Deleted

## 2021-05-24 ENCOUNTER — Encounter: Payer: Medicaid Other | Admitting: Obstetrics & Gynecology

## 2021-05-24 VITALS — BP 145/94 | HR 99

## 2021-05-24 DIAGNOSIS — O0990 Supervision of high risk pregnancy, unspecified, unspecified trimester: Secondary | ICD-10-CM

## 2021-05-24 DIAGNOSIS — Z3A36 36 weeks gestation of pregnancy: Secondary | ICD-10-CM

## 2021-05-24 DIAGNOSIS — O10013 Pre-existing essential hypertension complicating pregnancy, third trimester: Secondary | ICD-10-CM

## 2021-05-24 DIAGNOSIS — O09293 Supervision of pregnancy with other poor reproductive or obstetric history, third trimester: Secondary | ICD-10-CM

## 2021-05-24 DIAGNOSIS — O10919 Unspecified pre-existing hypertension complicating pregnancy, unspecified trimester: Secondary | ICD-10-CM | POA: Diagnosis present

## 2021-05-24 DIAGNOSIS — O99213 Obesity complicating pregnancy, third trimester: Secondary | ICD-10-CM

## 2021-05-24 DIAGNOSIS — E669 Obesity, unspecified: Secondary | ICD-10-CM

## 2021-05-24 DIAGNOSIS — O24419 Gestational diabetes mellitus in pregnancy, unspecified control: Secondary | ICD-10-CM | POA: Insufficient documentation

## 2021-05-24 DIAGNOSIS — O24410 Gestational diabetes mellitus in pregnancy, diet controlled: Secondary | ICD-10-CM | POA: Diagnosis not present

## 2021-05-24 DIAGNOSIS — O09213 Supervision of pregnancy with history of pre-term labor, third trimester: Secondary | ICD-10-CM | POA: Diagnosis not present

## 2021-05-24 DIAGNOSIS — Z6832 Body mass index (BMI) 32.0-32.9, adult: Secondary | ICD-10-CM | POA: Insufficient documentation

## 2021-05-25 ENCOUNTER — Encounter (HOSPITAL_COMMUNITY): Payer: Self-pay | Admitting: Obstetrics and Gynecology

## 2021-05-25 ENCOUNTER — Ambulatory Visit (INDEPENDENT_AMBULATORY_CARE_PROVIDER_SITE_OTHER): Payer: Medicaid Other | Admitting: Family Medicine

## 2021-05-25 ENCOUNTER — Encounter: Payer: Self-pay | Admitting: Family Medicine

## 2021-05-25 ENCOUNTER — Inpatient Hospital Stay (HOSPITAL_COMMUNITY)
Admission: AD | Admit: 2021-05-25 | Discharge: 2021-05-25 | Disposition: A | Discharge status: Home / Self Care | Payer: Medicaid Other | Attending: Obstetrics and Gynecology | Admitting: Obstetrics and Gynecology

## 2021-05-25 ENCOUNTER — Other Ambulatory Visit (HOSPITAL_COMMUNITY)
Admission: RE | Admit: 2021-05-25 | Discharge: 2021-05-25 | Disposition: A | Discharge status: Home / Self Care | Payer: Medicaid Other | Source: Ambulatory Visit | Attending: Obstetrics and Gynecology | Admitting: Obstetrics and Gynecology

## 2021-05-25 VITALS — BP 147/99 | HR 90 | Wt 199.5 lb

## 2021-05-25 DIAGNOSIS — Z3A36 36 weeks gestation of pregnancy: Secondary | ICD-10-CM | POA: Insufficient documentation

## 2021-05-25 DIAGNOSIS — I10 Essential (primary) hypertension: Secondary | ICD-10-CM | POA: Diagnosis not present

## 2021-05-25 DIAGNOSIS — R519 Headache, unspecified: Secondary | ICD-10-CM

## 2021-05-25 DIAGNOSIS — R1011 Right upper quadrant pain: Secondary | ICD-10-CM | POA: Diagnosis not present

## 2021-05-25 DIAGNOSIS — Z8249 Family history of ischemic heart disease and other diseases of the circulatory system: Secondary | ICD-10-CM | POA: Diagnosis not present

## 2021-05-25 DIAGNOSIS — O10919 Unspecified pre-existing hypertension complicating pregnancy, unspecified trimester: Secondary | ICD-10-CM

## 2021-05-25 DIAGNOSIS — O0990 Supervision of high risk pregnancy, unspecified, unspecified trimester: Secondary | ICD-10-CM

## 2021-05-25 DIAGNOSIS — O09299 Supervision of pregnancy with other poor reproductive or obstetric history, unspecified trimester: Secondary | ICD-10-CM

## 2021-05-25 DIAGNOSIS — O24410 Gestational diabetes mellitus in pregnancy, diet controlled: Secondary | ICD-10-CM

## 2021-05-25 DIAGNOSIS — O26893 Other specified pregnancy related conditions, third trimester: Secondary | ICD-10-CM | POA: Diagnosis not present

## 2021-05-25 DIAGNOSIS — O10913 Unspecified pre-existing hypertension complicating pregnancy, third trimester: Secondary | ICD-10-CM | POA: Diagnosis present

## 2021-05-25 DIAGNOSIS — Z3493 Encounter for supervision of normal pregnancy, unspecified, third trimester: Secondary | ICD-10-CM | POA: Diagnosis not present

## 2021-05-25 LAB — CBC
HCT: 41.6 % (ref 36.0–46.0)
Hemoglobin: 14 g/dL (ref 12.0–15.0)
MCH: 31.1 pg (ref 26.0–34.0)
MCHC: 33.7 g/dL (ref 30.0–36.0)
MCV: 92.4 fL (ref 80.0–100.0)
Platelets: 223 10*3/uL (ref 150–400)
RBC: 4.5 MIL/uL (ref 3.87–5.11)
RDW: 13.7 % (ref 11.5–15.5)
WBC: 9.1 10*3/uL (ref 4.0–10.5)
nRBC: 0 % (ref 0.0–0.2)

## 2021-05-25 LAB — PROTEIN / CREATININE RATIO, URINE
Creatinine, Urine: 36.59 mg/dL
Total Protein, Urine: <6 mg/dL

## 2021-05-25 LAB — COMPREHENSIVE METABOLIC PANEL
ALT: 18 U/L (ref 0–44)
AST: 24 U/L (ref 15–41)
Albumin: 3.1 g/dL — ABNORMAL LOW (ref 3.5–5.0)
Alkaline Phosphatase: 81 U/L (ref 38–126)
Anion gap: 7 (ref 5–15)
BUN: <5 mg/dL — ABNORMAL LOW (ref 6–20)
CO2: 21 mmol/L — ABNORMAL LOW (ref 22–32)
Calcium: 9.5 mg/dL (ref 8.9–10.3)
Chloride: 108 mmol/L (ref 98–111)
Creatinine, Ser: 0.52 mg/dL (ref 0.44–1.00)
GFR, Estimated: >60 mL/min (ref >60)
Glucose, Bld: 84 mg/dL (ref 70–99)
Potassium: 3.8 mmol/L (ref 3.5–5.1)
Sodium: 136 mmol/L (ref 135–145)
Total Bilirubin: 0.4 mg/dL (ref 0.3–1.2)
Total Protein: 6.8 g/dL (ref 6.5–8.1)

## 2021-05-25 MED ORDER — METOCLOPRAMIDE HCL 5 MG/ML IJ SOLN
10.0000 mg | Freq: Once | INTRAMUSCULAR | Status: AC
  Administered 2021-05-25: 10 mg via INTRAVENOUS
  Filled 2021-05-25: qty 2

## 2021-05-25 MED ORDER — DIPHENHYDRAMINE HCL 50 MG/ML IJ SOLN
25.0000 mg | Freq: Once | INTRAMUSCULAR | Status: AC
  Administered 2021-05-25: 25 mg via INTRAVENOUS
  Filled 2021-05-25: qty 1

## 2021-05-25 MED ORDER — DEXAMETHASONE SODIUM PHOSPHATE 10 MG/ML IJ SOLN
10.0000 mg | Freq: Once | INTRAMUSCULAR | Status: AC
  Administered 2021-05-25: 10 mg via INTRAVENOUS
  Filled 2021-05-25: qty 1

## 2021-05-25 NOTE — MAU Note (Signed)
Patient arrived from GYN office for elevated bp, headache, blurred vision, upper right abdominal sharp pain. That she is rating 6/10. + fm reported Patient denies vaginal bleeding and or leakage of fluid

## 2021-05-25 NOTE — Progress Notes (Signed)
   PRENATAL VISIT NOTE  Subjective:  Regina [name] is a 21 y.o. [phone] at [redacted]w[redacted]d being seen today for ongoing prenatal care.  She is currently monitored for the following issues for this high-risk pregnancy and has History of severe preeclampsia, prior pregnancy, currently pregnant; Chronic hypertension during pregnancy; Gestational diabetes mellitus (GDM), antepartum; and Supervision of high risk pregnancy, antepartum on their problem list.  Patient reports a headache and spots in her vision today.  She also has swelling in her hands and feet.  She reports she was told to go to the hospital yesterday at her MFM appointment but she was unable to go at that time.  She reports she is checking her sugars occasionally.  Her fasting this morning was in the low 100s but does not remember the actual value.  Contractions: Not present. Vag. Bleeding: None.  Movement: Present. Denies leaking of fluid.   The following portions of the patient's history were reviewed and updated as appropriate: allergies, current medications, past family history, past medical history, past social history, past surgical history and problem list.   Objective:   Vitals:   05/25/21 1009 05/25/21 1015  BP: (!) 157/104 (!) 147/99  Pulse: 93 90  Weight: 199 lb 8 oz (90.5 kg)     Fetal Status: Fetal Heart Rate (bpm): 158   Movement: Present  Chaperone present for exam.   General:  Alert, oriented and cooperative. Patient is in no acute distress.  Skin: Skin is warm and dry. No rash noted.   Cardiovascular: Normal heart rate noted.  Respiratory: Normal respiratory effort, no problems with respiration noted.  Abdomen: Soft, gravid, appropriate for gestational age.       Pelvic: Cervical exam deferred. Normal external genitalia, normal vaginal discharge present.   Extremities: Normal range of motion.  Trace LE edema bilaterally.  Mental Status: Normal mood and affect. Normal behavior. Normal judgment and thought content.    Assessment and Plan:  Pregnancy: G3P1102 at [redacted]w[redacted]d  1. Supervision of high risk pregnancy, antepartum 2. [redacted] weeks gestation of pregnancy FHT within normal limits. GBS and vaginal swabs obtained.   3. Chronic hypertension during pregnancy 4. History of severe preeclampsia, prior pregnancy, currently pregnant BP elevated today. On Procardia 30 mg. Symptomatic with headache and vision changes. Advised to go to MAU yesterday by MFM but was unable to at that time. Discussed concern for superimposed pre-eclampsia with severe features. Recommended that patient go to MAU for evaluation at this time. Patient reports she is able to go today and will go right after appointment. Discussed possibility of need for induction. Patient was breech on [place] yesterday. Will recheck in MAU. Discussed delivery planning pending baby's position. Discussed with MAU providers and L&D attending. All questions and concerns addressed.   5. Diet controlled gestational diabetes mellitus (GDM), antepartum Not checking sugars regularly. Reports fasting glucose this AM in low 100s. Last growth on 5/8 with EFW 47%.  Future Appointments  Date Time Provider Department Center  06/01/2021 10:30 AM [facility] NURSE [facility]  06/01/2021 10:45 AM [facility] NST [facility]  06/02/2021  3:35 PM Adam Phenix, MD [facility]   Worthy Rancher, MD

## 2021-05-25 NOTE — MAU Provider Note (Cosign Needed Addendum)
History     CSN: [id number]  Arrival date and time: 05/25/21 1254   Event Date/Time   First Provider Initiated Contact with Patient 05/25/21 1325      Chief Complaint  Patient presents with   Hypertension   HPI Regina [name] is a 21 y.o. [id number] at 6w5dwho presents to MAU from office for evaluation of headache, spots in her field of vision and RUQ pain 2/2 Chronic Hypertension on Procardia XL 30 mg daily. She denies vaginal bleeding, leaking of fluid, decreased fetal movement, fever, falls, or recent illness.   Headache Patient reports bilateral anterior headache. This is a recurrent problem. Pain score is 7/10. She denies aggravating or alleviating factors. She has not taken medication for this complaint.   Visual Disturbances This is a recurrent problem, onset about one week ago. Patient endorses spots in her field of vision.  RUQ Pain This is a new problem, onset today. Pain is "sharp" and does not radiate.   Patient's pregnancy is complicated by persistent breech position, hx severe preeclampsia, CHTN on Procardia XL 366mdaily.  Patient receives care with MCAccokeekShe has been NPO since 0800 today.  OB History     Gravida  3   Para  2   Term  1   Preterm  1   AB  0   Living  2      SAB  0   IAB  0   Ectopic  0   Multiple  0   Live Births  2           Past Medical History:  Diagnosis Date   Chronic hypertension 09/19/2019   GDM (gestational diabetes mellitus) 07/19/2019   Gestational diabetes    History of preterm delivery 08/02/2019   G1 at 3695w4de to preE   History of severe pre-eclampsia 04/02/2019   Hx of [place] spotted fever 07/2015    Past Surgical History:  Procedure Laterality Date   NO PAST SURGERIES      Family History  Problem Relation Age of Onset   Hypertension Mother     Social History   Tobacco Use   Smoking status: Never   Smokeless tobacco: Never  Vaping Use   Vaping Use: Never used  Substance Use  Topics   Alcohol use: Never   Drug use: Never    Allergies: No Known Allergies  Medications Prior to Admission  Medication Sig Dispense Refill Last Dose   Accu-Chek Softclix Lancets lancets Use as instructed. Check blood glucose 4 times daily. (Patient not taking: Reported on 05/25/2021) 100 each 6    aspirin EC 81 MG tablet Take 1 tablet (81 mg total) by mouth daily. Swallow whole. 30 tablet 11    Blood Glucose Monitoring Suppl (ACCU-CHEK GUIDE) w/Device KIT See admin instructions. (Patient not taking: Reported on 05/25/2021)      Elastic Bandages & Supports (COMFORT FIT MATERNITY SUPP MED) MISC 1 Units by Does not apply route as needed. 1 each 0    glucose blood (ACCU-CHEK GUIDE) test strip Use as instructed. Check blood glucose 4 times daily. 100 each 6    Magnesium 400 MG TABS Take 1 tablet by mouth daily. (Patient not taking: Reported on 05/11/2021) 30 tablet 2    NIFEdipine (PROCARDIA XL) 30 MG 24 hr tablet Take 1 tablet (30 mg total) by mouth daily. 30 tablet 3    Prenatal Vit-Fe Fumarate-FA (PREPLUS) 27-1 MG TABS Take 1 tablet by mouth daily. 30 tablet  11     Review of Systems  Eyes:  Positive for visual disturbance.  Gastrointestinal:  Positive for abdominal pain.       RUQ  Neurological:  Positive for headaches.  All other systems reviewed and are negative. Physical Exam   Blood pressure (!) 154/91, pulse 85, temperature 98 F (36.7 C), temperature source Oral, resp. rate 12, last menstrual period 09/10/2020, SpO2 97 %, currently breastfeeding.  Physical Exam Vitals and nursing note reviewed. Exam conducted with a chaperone present.  Constitutional:      Appearance: Normal appearance. She is not ill-appearing.  Cardiovascular:     Rate and Rhythm: Normal rate and regular rhythm.     Pulses: Normal pulses.     Heart sounds: Normal heart sounds.  Pulmonary:     Effort: Pulmonary effort is normal.     Breath sounds: Normal breath sounds.  Abdominal:     Comments: Gravid   Musculoskeletal:     Right lower leg: 1+ Edema present.     Left lower leg: 1+ Edema present.  Skin:    General: Skin is warm.     Capillary Refill: Capillary refill takes less than 2 seconds.  Neurological:     Mental Status: She is alert and oriented to person, place, and time.  Psychiatric:        Mood and Affect: Mood normal.        Behavior: Behavior normal.        Thought Content: Thought content normal.        Judgment: Judgment normal.    MAU Course  Procedures  MDM --Dr Nehemiah Settle called at 1343. Reviewed symptoms, persistent breech presentation. Verbal orders received to wait for PEC labs, response to IV HA cocktail  --1420: CNM at bedside. Patient sleeping. Dr. Nehemiah Settle in unit, update given including labs and response to interventions. Will plan for cesarean on Thursday 05/27/21  --Reactive tracing: baseline 145, mod var, + accels, no decels  --[place]: irregular contractions  Orders Placed This Encounter  Procedures   Protein / creatinine ratio, urine   CBC   Comprehensive metabolic panel   RPR   Diet NPO time specified   Type and screen [facility]   Patient Vitals for the past 24 hrs:  BP Temp Temp src Pulse Resp SpO2  05/25/21 1435 -- -- -- -- -- 98 %  05/25/21 1431 132/80 98.6 F (37 C) Oral 77 12 --  05/25/21 1430 -- -- -- -- -- 98 %  05/25/21 1425 -- -- -- -- -- 98 %  05/25/21 1423 135/78 -- -- 82 -- --  05/25/21 1416 (!) 141/80 -- -- 85 -- --  05/25/21 1415 -- -- -- -- -- 99 %  05/25/21 1410 -- -- -- -- -- 100 %  05/25/21 1407 (!) 155/98 -- -- (!) 101 -- --  05/25/21 1405 -- -- -- -- -- 99 %  05/25/21 1400 139/86 -- -- 92 -- 99 %  05/25/21 1355 -- -- -- -- -- 98 %  05/25/21 1350 -- -- -- -- -- 98 %  05/25/21 1345 -- -- -- -- -- 99 %  05/25/21 1340 -- -- -- -- -- 99 %  05/25/21 1335 -- -- -- -- -- 97 %  05/25/21 1331 (!) 154/91 -- -- 85 -- --  05/25/21 1330 -- -- -- -- -- 97 %  05/25/21 1325 -- -- -- -- -- 98 %  05/25/21 1320 --  -- -- -- -- 97 %  05/25/21 1318 (!) 146/93 98 F (36.7 C) Oral -- 12 --    Results for orders placed or performed during the hospital encounter of 05/25/21 (from the past 24 hour(s))  CBC     Status: None   Collection Time: 05/25/21  1:16 PM  Result Value Ref Range   WBC 9.1 4.0 - 10.5 K/uL   RBC 4.50 3.87 - 5.11 MIL/uL   Hemoglobin 14.0 12.0 - 15.0 g/dL   HCT 41.6 36.0 - 46.0 %   MCV 92.4 80.0 - 100.0 fL   MCH 31.1 26.0 - 34.0 pg   MCHC 33.7 30.0 - 36.0 g/dL   RDW 13.7 11.5 - 15.5 %   Platelets 223 150 - 400 K/uL   nRBC 0.0 0.0 - 0.2 %  Comprehensive metabolic panel     Status: Abnormal   Collection Time: 05/25/21  1:16 PM  Result Value Ref Range   Sodium 136 135 - 145 mmol/L   Potassium 3.8 3.5 - 5.1 mmol/L   Chloride 108 98 - 111 mmol/L   CO2 21 (L) 22 - 32 mmol/L   Glucose, Bld 84 70 - 99 mg/dL   BUN <5 (L) 6 - 20 mg/dL   Creatinine, Ser 0.52 0.44 - 1.00 mg/dL   Calcium 9.5 8.9 - 10.3 mg/dL   Total Protein 6.8 6.5 - 8.1 g/dL   Albumin 3.1 (L) 3.5 - 5.0 g/dL   AST 24 15 - 41 U/L   ALT 18 0 - 44 U/L   Alkaline Phosphatase 81 38 - 126 U/L   Total Bilirubin 0.4 0.3 - 1.2 mg/dL   GFR, Estimated >60 >60 mL/min   Anion gap 7 5 - 15  Protein / creatinine ratio, urine     Status: None   Collection Time: 05/25/21  1:27 PM  Result Value Ref Range   Creatinine, Urine 36.59 mg/dL   Total Protein, Urine <6 mg/dL   Protein Creatinine Ratio        0.00 - 0.15 mg/mg[Cre]  Type and screen [facility] [place]     Status: None   Collection Time: 05/25/21  1:50 PM  Result Value Ref Range   ABO/RH(D) O POS    Antibody Screen NEG    Sample Expiration      05/28/2021,2359 Performed at [facility], [address], [place], [place] [address]     Assessment and Plan  --21 y.o. [phone] at [redacted]w[redacted]d --Chronic HTN on Procardia XL 30 mg daily --PEC labs WNL --Reactive tracing --Headache resolved with medication given in MAU --Breech presentation --Care coordinated with  Dr. SNehemiah Settle--Discharge home in stable condition with strict return precautions  F/U: Patient scheduled for cesarean on Thursday 05/25  at 1500 hours  SDarlina Rumpf MSA, MSN, CNM 05/25/2021, 6:00 PM

## 2021-05-26 ENCOUNTER — Encounter (HOSPITAL_COMMUNITY): Payer: Self-pay

## 2021-05-26 ENCOUNTER — Telehealth: Payer: Self-pay | Admitting: Obstetrics and Gynecology

## 2021-05-26 LAB — RPR: RPR Ser Ql: NONREACTIVE

## 2021-05-26 LAB — CERVICOVAGINAL ANCILLARY ONLY
Chlamydia: NEGATIVE
Comment: NEGATIVE
Comment: NORMAL
Neisseria Gonorrhea: NEGATIVE

## 2021-05-26 NOTE — Telephone Encounter (Signed)
Contacted pt by phone to let her know that her c section had been moved to Friday afternoon. NPO status and arrival time reviewed with pt. Pt verbalized understanding.

## 2021-05-26 NOTE — Patient Instructions (Signed)
Ibtisam Benge Archuleta  05/26/2021   Your procedure is scheduled on:  05/28/2021  Arrive at 1:30 PM at Entrance C on [employer] at [facility]  and [employer]. You are invited to use the FREE valet parking or use the Visitor's parking deck.  Pick up the phone at the desk and dial [phone].  Call this number if you have problems the morning of surgery: [phone]  Remember:   Do not eat food:After 0500 AM  Do not drink clear liquids: (6 Hours before arrival) 6 horas ante llegada.  Take these medicines the morning of surgery with A SIP OF WATER:  Take nifedipine as prescribed   Do not wear jewelry, make-up or nail polish.  Do not wear lotions, powders, or perfumes. Do not wear deodorant.  Do not shave 48 hours prior to surgery.  Do not bring valuables to the hospital.  [facility] is not   responsible for any belongings or valuables brought to the hospital.  Contacts, dentures or bridgework may not be worn into surgery.  Leave suitcase in the car. After surgery it may be brought to your room.  For patients admitted to the hospital, checkout time is 11:00 AM the day of              discharge.      Please read over the following fact sheets that you were given:     Preparing for Surgery

## 2021-05-27 ENCOUNTER — Other Ambulatory Visit: Payer: Self-pay | Admitting: Family Medicine

## 2021-05-27 ENCOUNTER — Ambulatory Visit (HOSPITAL_COMMUNITY)
Admission: RE | Admit: 2021-05-27 | Discharge: 2021-05-27 | Disposition: A | Discharge status: Home / Self Care | Payer: Medicaid Other | Source: Ambulatory Visit | Attending: Obstetrics and Gynecology | Admitting: Obstetrics and Gynecology

## 2021-05-27 ENCOUNTER — Encounter (HOSPITAL_COMMUNITY)
Admission: AD | Disposition: A | Discharge status: Home / Self Care | Payer: Self-pay | Source: Home / Self Care | Attending: Obstetrics and Gynecology

## 2021-05-27 VITALS — Ht 63.0 in | Wt 199.0 lb

## 2021-05-27 DIAGNOSIS — O0990 Supervision of high risk pregnancy, unspecified, unspecified trimester: Secondary | ICD-10-CM

## 2021-05-27 DIAGNOSIS — O24410 Gestational diabetes mellitus in pregnancy, diet controlled: Secondary | ICD-10-CM | POA: Insufficient documentation

## 2021-05-27 DIAGNOSIS — Z3A00 Weeks of gestation of pregnancy not specified: Secondary | ICD-10-CM | POA: Insufficient documentation

## 2021-05-27 LAB — CBC
HCT: 39 % (ref 36.0–46.0)
Hemoglobin: 13.1 g/dL (ref 12.0–15.0)
MCH: 31.3 pg (ref 26.0–34.0)
MCHC: 33.6 g/dL (ref 30.0–36.0)
MCV: 93.1 fL (ref 80.0–100.0)
Platelets: 201 10*3/uL (ref 150–400)
RBC: 4.19 MIL/uL (ref 3.87–5.11)
RDW: 13.8 % (ref 11.5–15.5)
WBC: 7.4 10*3/uL (ref 4.0–10.5)
nRBC: 0 % (ref 0.0–0.2)

## 2021-05-27 LAB — TYPE AND SCREEN
ABO/RH(D): O POS
ABO/RH(D): O POS
Antibody Screen: NEGATIVE
Antibody Screen: NEGATIVE

## 2021-05-27 LAB — RAPID HIV SCREEN (HIV 1/2 AB+AG)
HIV 1/2 Antibodies: NONREACTIVE
HIV-1 P24 Antigen - HIV24: NONREACTIVE

## 2021-05-27 SURGERY — Surgical Case
Anesthesia: Spinal | Laterality: Bilateral

## 2021-05-28 ENCOUNTER — Other Ambulatory Visit: Payer: Self-pay

## 2021-05-28 ENCOUNTER — Inpatient Hospital Stay (HOSPITAL_COMMUNITY): Payer: Medicaid Other | Admitting: Anesthesiology

## 2021-05-28 ENCOUNTER — Inpatient Hospital Stay (HOSPITAL_COMMUNITY)
Admission: RE | Admit: 2021-05-28 | Discharge: 2021-05-30 | DRG: 787 | Disposition: A | Discharge status: Home / Self Care | Payer: Medicaid Other | Attending: Family Medicine | Admitting: Family Medicine

## 2021-05-28 ENCOUNTER — Encounter (HOSPITAL_COMMUNITY): Payer: Self-pay | Admitting: Family Medicine

## 2021-05-28 ENCOUNTER — Encounter (HOSPITAL_COMMUNITY)
Admission: RE | Disposition: A | Discharge status: Home / Self Care | Payer: Self-pay | Source: Home / Self Care | Attending: Family Medicine

## 2021-05-28 DIAGNOSIS — O24429 Gestational diabetes mellitus in childbirth, unspecified control: Secondary | ICD-10-CM

## 2021-05-28 DIAGNOSIS — Z3A37 37 weeks gestation of pregnancy: Secondary | ICD-10-CM | POA: Diagnosis not present

## 2021-05-28 DIAGNOSIS — O1002 Pre-existing essential hypertension complicating childbirth: Secondary | ICD-10-CM | POA: Diagnosis present

## 2021-05-28 DIAGNOSIS — O24420 Gestational diabetes mellitus in childbirth, diet controlled: Secondary | ICD-10-CM | POA: Diagnosis present

## 2021-05-28 DIAGNOSIS — O10919 Unspecified pre-existing hypertension complicating pregnancy, unspecified trimester: Secondary | ICD-10-CM | POA: Diagnosis present

## 2021-05-28 DIAGNOSIS — O321XX0 Maternal care for breech presentation, not applicable or unspecified: Secondary | ICD-10-CM | POA: Diagnosis present

## 2021-05-28 DIAGNOSIS — O09299 Supervision of pregnancy with other poor reproductive or obstetric history, unspecified trimester: Secondary | ICD-10-CM

## 2021-05-28 DIAGNOSIS — O0990 Supervision of high risk pregnancy, unspecified, unspecified trimester: Secondary | ICD-10-CM

## 2021-05-28 DIAGNOSIS — Z7982 Long term (current) use of aspirin: Secondary | ICD-10-CM

## 2021-05-28 DIAGNOSIS — Z98891 History of uterine scar from previous surgery: Secondary | ICD-10-CM

## 2021-05-28 DIAGNOSIS — O24419 Gestational diabetes mellitus in pregnancy, unspecified control: Secondary | ICD-10-CM | POA: Diagnosis present

## 2021-05-28 LAB — GLUCOSE, CAPILLARY
Glucose-Capillary: 88 mg/dL (ref 70–99)
Glucose-Capillary: 75 mg/dL (ref 70–99)

## 2021-05-28 SURGERY — Surgical Case
Anesthesia: Spinal

## 2021-05-28 MED ORDER — DIBUCAINE (PERIANAL) 1 % EX OINT: 1.0000 "application " | TOPICAL_OINTMENT | CUTANEOUS | Status: DC | PRN

## 2021-05-28 MED ORDER — NIFEDIPINE ER OSMOTIC RELEASE 30 MG PO TB24
30.0000 mg | ORAL_TABLET | Freq: Every day | ORAL | Status: DC
Start: 2021-05-29 — End: 2021-05-30
  Administered 2021-05-29 – 2021-05-30 (×2): 30 mg via ORAL
  Filled 2021-05-28 (×2): qty 1

## 2021-05-28 MED ORDER — FENTANYL CITRATE (PF) 100 MCG/2ML IJ SOLN
INTRAMUSCULAR | Status: DC | PRN
  Administered 2021-05-28: 15 ug via INTRATHECAL

## 2021-05-28 MED ORDER — PROMETHAZINE HCL 25 MG/ML IJ SOLN: 6.2500 mg | INTRAMUSCULAR | Status: DC | PRN

## 2021-05-28 MED ORDER — ENOXAPARIN SODIUM 40 MG/0.4ML IJ SOSY: 40.0000 mg | PREFILLED_SYRINGE | INTRAMUSCULAR | Status: DC

## 2021-05-28 MED ORDER — IBUPROFEN 600 MG PO TABS
600.0000 mg | ORAL_TABLET | Freq: Four times a day (QID) | ORAL | Status: DC
  Administered 2021-05-29 – 2021-05-30 (×3): 600 mg via ORAL
  Filled 2021-05-28 (×3): qty 1

## 2021-05-28 MED ORDER — SENNOSIDES-DOCUSATE SODIUM 8.6-50 MG PO TABS
2.0000 | ORAL_TABLET | Freq: Every day | ORAL | Status: DC
  Administered 2021-05-29 – 2021-05-30 (×2): 2 via ORAL
  Filled 2021-05-28 (×2): qty 2

## 2021-05-28 MED ORDER — SCOPOLAMINE 1 MG/3DAYS TD PT72: 1.0000 | MEDICATED_PATCH | Freq: Once | TRANSDERMAL | Status: DC

## 2021-05-28 MED ORDER — POVIDONE-IODINE 10 % EX SWAB: 2.0000 "application " | Freq: Once | CUTANEOUS | Status: DC

## 2021-05-28 MED ORDER — BUPIVACAINE HCL (PF) 0.25 % IJ SOLN
INTRAMUSCULAR | Status: AC
  Filled 2021-05-28: qty 30

## 2021-05-28 MED ORDER — TRANEXAMIC ACID-NACL 1000-0.7 MG/100ML-% IV SOLN
INTRAVENOUS | Status: DC | PRN
  Administered 2021-05-28: 1000 mg via INTRAVENOUS

## 2021-05-28 MED ORDER — TRANEXAMIC ACID-NACL 1000-0.7 MG/100ML-% IV SOLN
INTRAVENOUS | Status: AC
  Filled 2021-05-28: qty 100

## 2021-05-28 MED ORDER — SIMETHICONE 80 MG PO CHEW
80.0000 mg | CHEWABLE_TABLET | Freq: Three times a day (TID) | ORAL | Status: DC
  Administered 2021-05-29 – 2021-05-30 (×4): 80 mg via ORAL
  Filled 2021-05-28 (×4): qty 1

## 2021-05-28 MED ORDER — OXYCODONE HCL 5 MG PO TABS: 5.0000 mg | ORAL_TABLET | Freq: Once | ORAL | Status: DC | PRN

## 2021-05-28 MED ORDER — KETOROLAC TROMETHAMINE 30 MG/ML IJ SOLN
INTRAMUSCULAR | Status: AC
  Filled 2021-05-28: qty 1

## 2021-05-28 MED ORDER — PHENYLEPHRINE HCL-NACL 20-0.9 MG/250ML-% IV SOLN
INTRAVENOUS | Status: DC | PRN
  Administered 2021-05-28: 60 ug/min via INTRAVENOUS

## 2021-05-28 MED ORDER — OXYCODONE HCL 5 MG PO TABS
5.0000 mg | ORAL_TABLET | ORAL | Status: DC | PRN
  Filled 2021-05-28 (×2): qty 1

## 2021-05-28 MED ORDER — ACETAMINOPHEN 10 MG/ML IV SOLN
INTRAVENOUS | Status: AC
  Filled 2021-05-28: qty 100

## 2021-05-28 MED ORDER — PHENYLEPHRINE HCL-NACL 20-0.9 MG/250ML-% IV SOLN
INTRAVENOUS | Status: AC
  Filled 2021-05-28: qty 250

## 2021-05-28 MED ORDER — PRENATAL MULTIVITAMIN CH
1.0000 | ORAL_TABLET | Freq: Every day | ORAL | Status: DC
  Administered 2021-05-29: 1 via ORAL
  Filled 2021-05-28: qty 1

## 2021-05-28 MED ORDER — ONDANSETRON HCL 4 MG/2ML IJ SOLN: 4.0000 mg | Freq: Three times a day (TID) | INTRAMUSCULAR | Status: DC | PRN

## 2021-05-28 MED ORDER — DIPHENHYDRAMINE HCL 50 MG/ML IJ SOLN: 12.5000 mg | INTRAMUSCULAR | Status: DC | PRN

## 2021-05-28 MED ORDER — OXYCODONE HCL 5 MG/5ML PO SOLN: 5.0000 mg | Freq: Once | ORAL | Status: DC | PRN

## 2021-05-28 MED ORDER — FUROSEMIDE 20 MG PO TABS
20.0000 mg | ORAL_TABLET | Freq: Every day | ORAL | Status: DC
  Administered 2021-05-29 – 2021-05-30 (×2): 20 mg via ORAL
  Filled 2021-05-28 (×2): qty 1

## 2021-05-28 MED ORDER — HYDROMORPHONE HCL 1 MG/ML IJ SOLN: 0.2500 mg | INTRAMUSCULAR | Status: DC | PRN

## 2021-05-28 MED ORDER — MEPERIDINE HCL 25 MG/ML IJ SOLN: 6.2500 mg | INTRAMUSCULAR | Status: DC | PRN

## 2021-05-28 MED ORDER — ACETAMINOPHEN 500 MG PO TABS
1000.0000 mg | ORAL_TABLET | Freq: Four times a day (QID) | ORAL | Status: DC
  Administered 2021-05-29 – 2021-05-30 (×5): 1000 mg via ORAL
  Filled 2021-05-28 (×6): qty 2

## 2021-05-28 MED ORDER — BUPIVACAINE IN DEXTROSE 0.75-8.25 % IT SOLN
INTRATHECAL | Status: DC | PRN
  Administered 2021-05-28: 1.6 mL via INTRATHECAL

## 2021-05-28 MED ORDER — DEXAMETHASONE SODIUM PHOSPHATE 10 MG/ML IJ SOLN
INTRAMUSCULAR | Status: AC
Start: 2021-05-28 — End: ?
  Filled 2021-05-28: qty 1

## 2021-05-28 MED ORDER — ACETAMINOPHEN 10 MG/ML IV SOLN: 1000.0000 mg | Freq: Once | INTRAVENOUS | Status: DC | PRN

## 2021-05-28 MED ORDER — KETOROLAC TROMETHAMINE 30 MG/ML IJ SOLN
30.0000 mg | Freq: Four times a day (QID) | INTRAMUSCULAR | Status: DC | PRN
  Administered 2021-05-28: 30 mg via INTRAVENOUS

## 2021-05-28 MED ORDER — SIMETHICONE 80 MG PO CHEW: 80.0000 mg | CHEWABLE_TABLET | ORAL | Status: DC | PRN

## 2021-05-28 MED ORDER — ENOXAPARIN SODIUM 40 MG/0.4ML IJ SOSY
40.0000 mg | PREFILLED_SYRINGE | INTRAMUSCULAR | Status: DC
  Administered 2021-05-29 – 2021-05-30 (×2): 40 mg via SUBCUTANEOUS
  Filled 2021-05-28 (×2): qty 0.4

## 2021-05-28 MED ORDER — COCONUT OIL OIL: 1.0000 "application " | TOPICAL_OIL | Status: DC | PRN

## 2021-05-28 MED ORDER — OXYTOCIN-SODIUM CHLORIDE 30-0.9 UT/500ML-% IV SOLN: 2.5000 [IU]/h | INTRAVENOUS | Status: AC

## 2021-05-28 MED ORDER — NALOXONE HCL 4 MG/10ML IJ SOLN: 1.0000 ug/kg/h | INTRAVENOUS | Status: DC | PRN

## 2021-05-28 MED ORDER — OXYTOCIN-SODIUM CHLORIDE 30-0.9 UT/500ML-% IV SOLN
INTRAVENOUS | Status: AC
  Filled 2021-05-28: qty 500

## 2021-05-28 MED ORDER — ONDANSETRON HCL 4 MG/2ML IJ SOLN
INTRAMUSCULAR | Status: DC | PRN
  Administered 2021-05-28: 4 mg via INTRAVENOUS

## 2021-05-28 MED ORDER — NALOXONE HCL 0.4 MG/ML IJ SOLN: 0.4000 mg | INTRAMUSCULAR | Status: DC | PRN

## 2021-05-28 MED ORDER — FENTANYL CITRATE (PF) 100 MCG/2ML IJ SOLN
INTRAMUSCULAR | Status: AC
  Filled 2021-05-28: qty 2

## 2021-05-28 MED ORDER — DEXAMETHASONE SODIUM PHOSPHATE 10 MG/ML IJ SOLN
INTRAMUSCULAR | Status: DC | PRN
  Administered 2021-05-28: 10 mg via INTRAVENOUS

## 2021-05-28 MED ORDER — MENTHOL 3 MG MT LOZG: 1.0000 | LOZENGE | OROMUCOSAL | Status: DC | PRN

## 2021-05-28 MED ORDER — DEXMEDETOMIDINE (PRECEDEX) IN NS 20 MCG/5ML (4 MCG/ML) IV SYRINGE
PREFILLED_SYRINGE | INTRAVENOUS | Status: DC | PRN
  Administered 2021-05-28: 12 ug via INTRAVENOUS

## 2021-05-28 MED ORDER — CEFAZOLIN SODIUM-DEXTROSE 2-4 GM/100ML-% IV SOLN
2.0000 g | INTRAVENOUS | Status: AC
  Administered 2021-05-28: 2 g via INTRAVENOUS

## 2021-05-28 MED ORDER — DIPHENHYDRAMINE HCL 25 MG PO CAPS: 25.0000 mg | ORAL_CAPSULE | Freq: Four times a day (QID) | ORAL | Status: DC | PRN

## 2021-05-28 MED ORDER — ACETAMINOPHEN 10 MG/ML IV SOLN
INTRAVENOUS | Status: DC | PRN
  Administered 2021-05-28: 1000 mg via INTRAVENOUS

## 2021-05-28 MED ORDER — LACTATED RINGERS IV SOLN
INTRAVENOUS | Status: DC
Start: 2021-05-28 — End: 2021-05-28

## 2021-05-28 MED ORDER — ONDANSETRON HCL 4 MG/2ML IJ SOLN
INTRAMUSCULAR | Status: AC
  Filled 2021-05-28: qty 2

## 2021-05-28 MED ORDER — LACTATED RINGERS IV SOLN: INTRAVENOUS | Status: DC

## 2021-05-28 MED ORDER — FENTANYL CITRATE (PF) 100 MCG/2ML IJ SOLN
INTRAMUSCULAR | Status: DC | PRN
  Administered 2021-05-28: 85 ug via INTRAVENOUS

## 2021-05-28 MED ORDER — BUPIVACAINE HCL 0.25 % IJ SOLN
INTRAMUSCULAR | Status: DC | PRN
  Administered 2021-05-28: 30 mL

## 2021-05-28 MED ORDER — MORPHINE SULFATE (PF) 0.5 MG/ML IJ SOLN
INTRAMUSCULAR | Status: DC | PRN
  Administered 2021-05-28: 150 ug via INTRATHECAL

## 2021-05-28 MED ORDER — OXYTOCIN-SODIUM CHLORIDE 30-0.9 UT/500ML-% IV SOLN
INTRAVENOUS | Status: DC | PRN
  Administered 2021-05-28: 30 [IU] via INTRAVENOUS

## 2021-05-28 MED ORDER — MORPHINE SULFATE (PF) 0.5 MG/ML IJ SOLN
INTRAMUSCULAR | Status: AC
  Filled 2021-05-28: qty 10

## 2021-05-28 MED ORDER — DIPHENHYDRAMINE HCL 25 MG PO CAPS: 25.0000 mg | ORAL_CAPSULE | ORAL | Status: DC | PRN

## 2021-05-28 MED ORDER — KETOROLAC TROMETHAMINE 30 MG/ML IJ SOLN: 30.0000 mg | Freq: Four times a day (QID) | INTRAMUSCULAR | Status: DC | PRN

## 2021-05-28 MED ORDER — WITCH HAZEL-GLYCERIN EX PADS: 1.0000 "application " | MEDICATED_PAD | CUTANEOUS | Status: DC | PRN

## 2021-05-28 MED ORDER — SODIUM CHLORIDE 0.9% FLUSH: 3.0000 mL | INTRAVENOUS | Status: DC | PRN

## 2021-05-28 MED ORDER — KETOROLAC TROMETHAMINE 30 MG/ML IJ SOLN
30.0000 mg | Freq: Four times a day (QID) | INTRAMUSCULAR | Status: AC
  Administered 2021-05-29: 30 mg via INTRAVENOUS
  Filled 2021-05-28 (×2): qty 1

## 2021-05-28 MED ORDER — CEFAZOLIN SODIUM-DEXTROSE 2-4 GM/100ML-% IV SOLN
INTRAVENOUS | Status: AC
  Filled 2021-05-28: qty 100

## 2021-05-28 SURGICAL SUPPLY — 34 items
BENZOIN TINCTURE PRP APPL 2/3 (GAUZE/BANDAGES/DRESSINGS) ×2 IMPLANT
CHLORAPREP W/TINT 26 (MISCELLANEOUS) ×4 IMPLANT
CLAMP CORD UMBIL (MISCELLANEOUS) ×2 IMPLANT
CLOTH BEACON ORANGE TIMEOUT ST (SAFETY) ×2 IMPLANT
DRSG OPSITE POSTOP 4X10 (GAUZE/BANDAGES/DRESSINGS) ×2 IMPLANT
ELECT REM PT RETURN 9FT ADLT (ELECTROSURGICAL) ×2
ELECTRODE REM PT RTRN 9FT ADLT (ELECTROSURGICAL) ×1 IMPLANT
EXTRACTOR VACUUM M CUP 4 TUBE (SUCTIONS) IMPLANT
GAUZE PAD ABD 8X10 STRL (GAUZE/BANDAGES/DRESSINGS) ×1 IMPLANT
GLOVE BIOGEL PI IND STRL 7.0 (GLOVE) ×3 IMPLANT
GLOVE BIOGEL PI INDICATOR 7.0 (GLOVE) ×3
GLOVE ECLIPSE 7.0 STRL STRAW (GLOVE) ×2 IMPLANT
GOWN STRL REUS W/TWL LRG LVL3 (GOWN DISPOSABLE) ×4 IMPLANT
KIT ABG SYR 3ML LUER SLIP (SYRINGE) ×2 IMPLANT
NDL HYPO 25X5/8 SAFETYGLIDE (NEEDLE) ×1 IMPLANT
NEEDLE HYPO 22GX1.5 SAFETY (NEEDLE) ×2 IMPLANT
NEEDLE HYPO 25X5/8 SAFETYGLIDE (NEEDLE) ×2 IMPLANT
NS IRRIG 1000ML POUR BTL (IV SOLUTION) ×2 IMPLANT
PACK C SECTION WH (CUSTOM PROCEDURE TRAY) ×2 IMPLANT
PAD ABD 7.5X8 STRL (GAUZE/BANDAGES/DRESSINGS) ×2 IMPLANT
PAD OB MATERNITY 4.3X12.25 (PERSONAL CARE ITEMS) ×2 IMPLANT
RTRCTR C-SECT PINK 25CM LRG (MISCELLANEOUS) ×2 IMPLANT
SPONGE GAUZE 4X4 12PLY STER LF (GAUZE/BANDAGES/DRESSINGS) ×2 IMPLANT
STRIP CLOSURE SKIN 1/2X4 (GAUZE/BANDAGES/DRESSINGS) ×2 IMPLANT
SUT MNCRL 0 VIOLET CTX 36 (SUTURE) ×2 IMPLANT
SUT MONOCRYL 0 CTX 36 (SUTURE) ×2
SUT PROLENE 1 CT (SUTURE) ×1 IMPLANT
SUT VIC AB 0 CTX 36 (SUTURE) ×1
SUT VIC AB 0 CTX36XBRD ANBCTRL (SUTURE) ×1 IMPLANT
SUT VIC AB 4-0 KS 27 (SUTURE) ×2 IMPLANT
SYR 30ML LL (SYRINGE) ×2 IMPLANT
TOWEL OR 17X24 6PK STRL BLUE (TOWEL DISPOSABLE) ×2 IMPLANT
TRAY FOLEY W/BAG SLVR 14FR LF (SET/KITS/TRAYS/PACK) ×2 IMPLANT
WATER STERILE IRR 1000ML POUR (IV SOLUTION) ×2 IMPLANT

## 2021-05-28 NOTE — Anesthesia Procedure Notes (Signed)
Spinal ° °Patient location during procedure: OR °Reason for block: surgical anesthesia °Staffing °Performed: anesthesiologist  °Anesthesiologist: Dameshia Seybold E, MD °Preanesthetic Checklist °Completed: patient identified, IV checked, risks and benefits discussed, surgical consent, monitors and equipment checked, pre-op evaluation and timeout performed °Spinal Block °Patient position: sitting °Prep: DuraPrep and site prepped and draped °Patient monitoring: continuous pulse ox, blood pressure and heart rate °Approach: midline °Location: L3-4 °Injection technique: single-shot °Needle °Needle type: Pencan  °Needle gauge: 24 G °Needle length: 10 cm °Assessment °Events: CSF return °Additional Notes °Functioning IV was confirmed and monitors were applied. Sterile prep and drape, including hand hygiene and sterile gloves were used. The patient was positioned and the spine was prepped. The skin was anesthetized with lidocaine.  Free flow of clear CSF was obtained prior to injecting local anesthetic into the CSF. The needle was carefully withdrawn. The patient tolerated the procedure well.  ° ° ° °

## 2021-05-28 NOTE — Lactation Note (Signed)
This note was copied from a baby's chart. Lactation Consultation Note  Patient Name: Regina [name] Date: 05/28/2021 Reason for consult: Initial assessment;Early term 37-38.6wks;Maternal endocrine disorder, CHTN and C/S delivery. Age:21 hours Per mom, her current feeding choice is : breast and formula feeding. Per mom, infant recently breastfeed at 2240 for 10 minutes and then supplemented with 7 mls of formula prior to [facility] entering the room. Infant start fussing and cuing again to BF. Mom latched infant again on her left breast using the cradle hold, infant latched with depth and was still breastfeeding after 7 minutes when LC left the room. Mom will try latch infant on both breast during a feeding. Mom knows to BF by cues, on demand, 8 to 12 times within 24 hours, skin to skin. Mom was taught hand expression with breast model but a spear of colostrum present. Mom will continue to latch infant at breast 1st for  every feeding to help stimulate her milk supply, before supplementing infant with formula. Mom will possibly use DEBP in morning her choice.  Mom knows to call RN/LC if she needs further assistance with latching infant at the breast.  Mom made aware of O/P services, breastfeeding support groups, community resources, and our phone # for post-discharge questions.   Maternal Data Has patient been taught Hand Expression?: Yes Does the patient have breastfeeding experience prior to this delivery?: Yes How long did the patient breastfeed?: Per mom, she BF her other two children for one year each.  Feeding Mother's Current Feeding Choice: Breast Milk and Formula  LATCH Score Latch: Grasps breast easily, tongue down, lips flanged, rhythmical sucking.  Audible Swallowing: A few with stimulation  Type of Nipple: Everted at rest and after stimulation  Comfort (Breast/Nipple): Soft / non-tender  Hold (Positioning): Assistance needed to correctly position infant at breast and  maintain latch.  LATCH Score: 8   Lactation Tools Discussed/Used    Interventions Interventions: Breast feeding basics reviewed;Assisted with latch;Skin to skin;Hand express;Breast compression;Adjust position;Support pillows;Position options;Education;LC Services brochure  Discharge    Consult Status Consult Status: Follow-up Date: 05/29/21 Follow-up type: In-patient    Danelle Earthly 05/28/2021, 11:25 PM

## 2021-05-28 NOTE — Anesthesia Postprocedure Evaluation (Signed)
Anesthesia Post Note  Patient: Regina [name]  Procedure(s) Performed: CESAREAN SECTION     Patient location during evaluation: PACU Anesthesia Type: Spinal Level of consciousness: oriented and awake and alert Pain management: pain level controlled Vital Signs Assessment: post-procedure vital signs reviewed and stable Respiratory status: spontaneous breathing, respiratory function stable and nonlabored ventilation Cardiovascular status: blood pressure returned to baseline and stable Postop Assessment: no headache, no backache, no apparent nausea or vomiting and spinal receding Anesthetic complications: no   No notable events documented.  Last Vitals:  Vitals:   05/28/21 1730 05/28/21 1745  BP:  131/85  Pulse: 70 76  Resp: (!) 21 18  Temp: 36.6 C 36.8 C  SpO2: 99% 100%    Last Pain:  Vitals:   05/28/21 1730  TempSrc: Oral   Pain Goal:    LLE Motor Response: Purposeful movement (05/28/21 1730) LLE Sensation: Tingling (05/28/21 1730) RLE Motor Response: Purposeful movement (05/28/21 1730) RLE Sensation: Tingling (05/28/21 1730) L Sensory Level: T10-Umbilical region (05/28/21 1730) R Sensory Level: T10-Umbilical region (05/28/21 1730) Epidural/Spinal Function Cutaneous sensation: Tingles (05/28/21 1715), Patient able to flex knees: No (05/28/21 1715), Patient able to lift hips off bed: No (05/28/21 1715), Back pain beyond tenderness at insertion site: No (05/28/21 1715), Progressively worsening motor and/or sensory loss: No (05/28/21 1715), Bowel and/or bladder incontinence post epidural: No (05/28/21 1715)  Lucretia Kern

## 2021-05-28 NOTE — Op Note (Cosign Needed)
Regina [name]  PROCEDURE DATE: 05/28/2021  PREOPERATIVE DIAGNOSES: Intrauterine pregnancy at [redacted]w[redacted]d weeks gestation; breech presentation; poorly controlled cHTN; gestational diabetes   POSTOPERATIVE DIAGNOSES: The same  PROCEDURE: Primary Low Transverse Cesarean Section  SURGEON:  Dr. Tinnie Gens   ASSISTANT:  Dr. Evalina Field  An experienced assistant was required given the standard of surgical care given the complexity of the case.  This assistant was needed for exposure, dissection, suctioning, retraction, instrument exchange, assisting with delivery with administration of fundal pressure, and for overall help during the procedure.  ANESTHESIOLOGY TEAM: Anesthesiologist: Lucretia Kern, MD CRNA: Elgie Congo, CRNA  INDICATIONS: Regina [name] is a 21 y.o. [phone] at [redacted]w[redacted]d here for cesarean section secondary to the indications listed under preoperative diagnoses; please see preoperative note for further details.  The risks of cesarean section were discussed with the patient including but were not limited to: bleeding which may require transfusion or reoperation; infection which may require antibiotics; injury to bowel, bladder, ureters or other surrounding organs; injury to the fetus; need for additional procedures including hysterectomy in the event of a life-threatening hemorrhage; placental abnormalities wth subsequent pregnancies, incisional problems, thromboembolic phenomenon and other postoperative/anesthesia complications.   The patient concurred with the proposed plan, giving informed written consent for the procedure.    FINDINGS:  Viable female infant in complete breech presentation.  Apgars 8 and 9.  Clear amniotic fluid.  Intact placenta, three vessel cord.  Normal uterus, fallopian tubes and ovaries bilaterally.  ANESTHESIA: Spinal  INTRAVENOUS FLUIDS: 1000 ml   ESTIMATED BLOOD LOSS: 432 ml URINE OUTPUT:  150 ml SPECIMENS: Placenta sent to L&D  COMPLICATIONS: None  immediate  PROCEDURE IN DETAIL:   The patient preoperatively received intravenous antibiotics and had sequential compression devices applied to her lower extremities.  She was then taken to the operating room where spinal anesthesia was administered and was found to be adequate. She was then placed in a dorsal supine position with a leftward tilt, and prepped and draped in a sterile manner.  A foley catheter was placed into her bladder and attached to constant gravity.    After an adequate timeout was performed, a Pfannenstiel skin incision was made with a scalpel and carried through to the underlying layer of fascia. The fascia was incised in the midline, and this incision was extended bilaterally bluntly.  The rectus muscles were separated in the midline and the peritoneum was entered bluntly. The Alexis self-retaining retractor was introduced into the abdominal cavity.    Attention was turned to the lower uterine segment where a low transverse hysterotomy was made with a scalpel and extended bilaterally bluntly.  Infant found to be in complete breech presentation.  Attempted to deliver buttocks and hips through hysterotomy, which was unsuccessful.  The right leg was then delivered, the infant was rotated, and the left leg was then able to be delivered.  The body was then delivered up to the shoulders and then the arms were delivered.  The head was flexed and was successfully delivered through the hysterotomy. The cord was clamped and cut after one minute, and the infant was handed over to the awaiting neonatology team. Uterine massage was then administered, and the placenta delivered intact with a three-vessel cord. The uterus was then cleared of clots and debris.    The hysterotomy was closed with 0 Monocryl in a running fashion.  The pelvis was cleared of all clot and debris. Hemostasis was confirmed on all surfaces.  The  retractor was removed.    The peritoneum was closed with a 0 Monocryl purse  string stitch.  The fascia was then closed using 0 Vicryl in a running fashion.  The subcutaneous layer was irrigated, re-approximated with a 2-0 plain gut running stitch, and the skin was closed with a 4-0 Vicryl subcuticular stitch. The patient tolerated the procedure well. Sponge, instrument and needle counts were correct x 3.  She was taken to the recovery room in stable condition.   Evalina Field, MD  OB Fellow  Faculty Practice

## 2021-05-28 NOTE — Transfer of Care (Signed)
Immediate Anesthesia Transfer of Care Note  Patient: Regina [name]  Procedure(s) Performed: CESAREAN SECTION  Patient Location: PACU  Anesthesia Type:Spinal  Level of Consciousness: awake, alert  and oriented  Airway & Oxygen Therapy: Patient Spontanous Breathing  Post-op Assessment: Report given to RN and Post -op Vital signs reviewed and stable  Post vital signs: Reviewed and stable  Last Vitals:  Vitals Value Taken Time  BP 134/84 05/28/21 1633  Temp    Pulse 71 05/28/21 1636  Resp 20 05/28/21 1636  SpO2 100 % 05/28/21 1636  Vitals shown include unvalidated device data.  Last Pain:  Vitals:   05/28/21 1336  TempSrc: Oral         Complications: No notable events documented.
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
## 2021-05-28 NOTE — Anesthesia Preprocedure Evaluation (Signed)
Anesthesia Evaluation  Patient identified by MRN, date of birth, ID band Patient awake    Reviewed: Allergy & Precautions, H&P , NPO status , Patient's Chart, lab work & pertinent test results  History of Anesthesia Complications Negative for: history of anesthetic complications  Airway Mallampati: II  TM Distance: >3 FB     Dental   Pulmonary neg pulmonary ROS,    Pulmonary exam normal        Cardiovascular hypertension,  Rhythm:regular Rate:Normal     Neuro/Psych negative neurological ROS  negative psych ROS   GI/Hepatic negative GI ROS, Neg liver ROS,   Endo/Other  diabetes, Gestational  Renal/GU negative Renal ROS  negative genitourinary   Musculoskeletal negative musculoskeletal ROS (+)   Abdominal   Peds  Hematology negative hematology ROS (+)   Anesthesia Other Findings   Reproductive/Obstetrics (+) Pregnancy [id number] at [redacted]w[redacted]d Breech presentation                             Anesthesia Physical Anesthesia Plan  ASA: 2  Anesthesia Plan: Spinal   Post-op Pain Management:    Induction:   PONV Risk Score and Plan: Ondansetron and Treatment may vary due to age or medical condition  Airway Management Planned:   Additional Equipment:   Intra-op Plan:   Post-operative Plan:   Informed Consent: I have reviewed the patients History and Physical, chart, labs and discussed the procedure including the risks, benefits and alternatives for the proposed anesthesia with the patient or authorized representative who has indicated his/her understanding and acceptance.       Plan Discussed with: Anesthesiologist  Anesthesia Plan Comments:         Anesthesia Quick Evaluation

## 2021-05-28 NOTE — Discharge Summary (Signed)
Postpartum Discharge Summary  Date of Service updated***     Patient Name: Regina [name] DOB: 2000/05/05 MRN: [id number]  Date of admission: 05/28/2021 Delivery date:05/28/2021  Delivering provider: Donnamae Jude  Date of discharge: 05/28/2021  Admitting diagnosis: S/P primary low transverse C-section [Z98.891] Intrauterine pregnancy: [redacted]w[redacted]d    Secondary diagnosis:  Principal Problem:   S/P primary low transverse C-section Active Problems:   History of severe preeclampsia, prior pregnancy, currently pregnant   Chronic hypertension during pregnancy   Gestational diabetes mellitus (GDM), antepartum   Supervision of high risk pregnancy, antepartum   Breech presentation  Additional problems: Acute blood loss anemia ***    Discharge diagnosis: Term Pregnancy Delivered                                              Post partum procedures: {Postpartum procedures:23558} Augmentation: N/A Complications: None  Hospital course: Sceduled C/S - 21y.o. yo G3P2103 at 321w1das admitted to the hospital 05/28/2021 for scheduled cesarean section with the following indication: Malpresentation.  Delivery details are as follows:  Membrane Rupture Time/Date: 3:50 PM ,05/28/2021   Delivery Method:C-Section, Low Transverse  Details of operation can be found in separate operative note.  Patient had an uncomplicated postpartum course.  Her hemoglobin on POD#1 was ***, for which she received ***. She was continued on her Procardia 30 mg for BP control with ***.  She was started on Lasix postpartum, which she will continue to complete a 5 day course.  She is ambulating, tolerating a regular diet, passing flatus, and urinating well.  Her pain and bleeding are controlled.  Patient is discharged home in stable condition on  05/28/21        Newborn Data: Birth date:05/28/2021  Birth time:3:51 PM  Gender:Female  Living status:Living  Apgars:8 ,9  Weight:3140 g     Magnesium Sulfate received: No BMZ received:  No Rhophylac: N/A MMR: N/A - Immune  T-DaP: Given prenatally Flu: Given prenatally  Transfusion: {Transfusion received:30440034}  Physical exam  Vitals:   05/28/21 1730 05/28/21 1745 05/28/21 1843 05/28/21 1925  BP:  131/85 135/84 132/77  Pulse: 70 76 64 63  Resp: (!) '21 18 18 18  ' Temp: 97.9 F (36.6 C) 98.3 F (36.8 C) (!) 97.5 F (36.4 C) 97.6 F (36.4 C)  TempSrc: Oral  Oral Oral  SpO2: 99% 100% 100%   Weight:      Height:       General: {Exam; general:21111117} Lochia: {Desc; appropriate/inappropriate:30686::"appropriate"} Uterine Fundus: {Desc; firm/soft:30687} Incision: {Exam; incision:21111123} DVT Evaluation: {Exam; dvTIR:4431540}Labs: Lab Results  Component Value Date   WBC 7.4 05/27/2021   HGB 13.1 05/27/2021   HCT 39.0 05/27/2021   MCV 93.1 05/27/2021   PLT 201 05/27/2021      Latest Ref Rng & Units 05/25/2021    1:16 PM  CMP  Glucose 70 - 99 mg/dL 84    BUN 6 - 20 mg/dL <5    Creatinine 0.44 - 1.00 mg/dL 0.52    Sodium 135 - 145 mmol/L 136    Potassium 3.5 - 5.1 mmol/L 3.8    Chloride 98 - 111 mmol/L 108    CO2 22 - 32 mmol/L 21    Calcium 8.9 - 10.3 mg/dL 9.5    Total Protein 6.5 - 8.1 g/dL 6.8    Total  Bilirubin 0.3 - 1.2 mg/dL 0.4    Alkaline Phos 38 - 126 U/L 81    AST 15 - 41 U/L 24    ALT 0 - 44 U/L 18     Edinburgh Score:    05/28/2021    6:03 PM  Edinburgh Postnatal Depression Scale Screening Tool  I have been able to laugh and see the funny side of things. 0  I have looked forward with enjoyment to things. 0  I have blamed myself unnecessarily when things went wrong. 1  I have been anxious or worried for no good reason. 0  I have felt scared or panicky for no good reason. 0  Things have been getting on top of me. 0  I have been so unhappy that I have had difficulty sleeping. 0  I have felt sad or miserable. 0  I have been so unhappy that I have been crying. 0  The thought of harming myself has occurred to me. 0  Edinburgh  Postnatal Depression Scale Total 1     After visit meds:  Allergies as of 05/28/2021   No Known Allergies   Med Rec must be completed prior to using this [facility]***        Discharge home in stable condition Infant Feeding: Breast Infant Disposition: {CHL IP OB HOME WITH NRWCHJ:64383} Discharge instruction: per After Visit Summary and Postpartum booklet. Activity: Advance as tolerated. Pelvic rest for 6 weeks.  Diet: routine diet Future Appointments: Future Appointments  Date Time Provider [place]  06/01/2021 10:30 AM [facility] NURSE [facility]  06/01/2021 10:45 AM [facility] NST [facility]  06/02/2021  3:35 PM Woodroe Mode, MD [facility]   Follow up Visit: Message sent to [facility] by Dr. Gwenlyn Perking on 05/28/21.   Please schedule this patient for a In person postpartum visit in 6 weeks with the following provider: Any provider. Additional Postpartum F/U: 2 hour GTT, Incision check 1 week, and BP check 1 week  High risk pregnancy complicated by: GDM and HTN Delivery mode:  C-Section, Low Transverse  Anticipated Birth Control:  POPs  05/28/2021 Genia Del, MD

## 2021-05-29 ENCOUNTER — Encounter (HOSPITAL_COMMUNITY): Payer: Self-pay | Admitting: Family Medicine

## 2021-05-29 LAB — COMPREHENSIVE METABOLIC PANEL WITH GFR
ALT: 14 U/L (ref 0–44)
AST: 21 U/L (ref 15–41)
Albumin: 2.4 g/dL — ABNORMAL LOW (ref 3.5–5.0)
Alkaline Phosphatase: 57 U/L (ref 38–126)
Anion gap: 8 (ref 5–15)
BUN: <5 mg/dL — ABNORMAL LOW (ref 6–20)
CO2: 21 mmol/L — ABNORMAL LOW (ref 22–32)
Calcium: 8.7 mg/dL — ABNORMAL LOW (ref 8.9–10.3)
Chloride: 105 mmol/L (ref 98–111)
Creatinine, Ser: 0.53 mg/dL (ref 0.44–1.00)
GFR, Estimated: >60 mL/min
Glucose, Bld: 98 mg/dL (ref 70–99)
Potassium: 3.7 mmol/L (ref 3.5–5.1)
Sodium: 134 mmol/L — ABNORMAL LOW (ref 135–145)
Total Bilirubin: 0.2 mg/dL — ABNORMAL LOW (ref 0.3–1.2)
Total Protein: 5.2 g/dL — ABNORMAL LOW (ref 6.5–8.1)

## 2021-05-29 LAB — CBC
HCT: 33.5 % — ABNORMAL LOW (ref 36.0–46.0)
Hemoglobin: 11.3 g/dL — ABNORMAL LOW (ref 12.0–15.0)
MCH: 31.3 pg (ref 26.0–34.0)
MCHC: 33.7 g/dL (ref 30.0–36.0)
MCV: 92.8 fL (ref 80.0–100.0)
Platelets: 210 10*3/uL (ref 150–400)
RBC: 3.61 MIL/uL — ABNORMAL LOW (ref 3.87–5.11)
RDW: 13.3 % (ref 11.5–15.5)
WBC: 14.6 10*3/uL — ABNORMAL HIGH (ref 4.0–10.5)
nRBC: 0 % (ref 0.0–0.2)

## 2021-05-29 LAB — CULTURE, BETA STREP (GROUP B ONLY): Strep Gp B Culture: POSITIVE — AB

## 2021-05-29 NOTE — Lactation Note (Signed)
This note was copied from a baby's chart. Lactation Consultation Note  Patient Name: Regina [name] [id number] Date: 05/29/2021 Reason for consult: Follow-up assessment;Early term 37-38.6wks;Breastfeeding assistance Age:21 hours  LC entered the room and mom was feeding baby formula. Per mom, she breast fed baby about 20 min ago. She states that everything is going well and denies any pain.   LC encouraged mom to call for latch assistance if needed.   [place] wrote her name on the board and exited the room.  Maternal Data    Feeding Mother's Current Feeding Choice: Breast Milk and Formula  LATCH Score                    Lactation Tools Discussed/Used    Interventions Interventions: Education  Discharge    Consult Status Consult Status: Follow-up Date: 05/30/21 Follow-up type: In-patient    Lysbeth Penner 05/29/2021, 2:34 PM

## 2021-05-29 NOTE — Progress Notes (Signed)
Subjective: Postpartum Day 1: Cesarean Delivery Patient reports incisional pain, tolerating PO, and no problems voiding.    Objective: Vital signs in last 24 hours: Temp:  [97.2 F (36.2 C)-98.3 F (36.8 C)] 97.2 F (36.2 C) (05/27 0909) Pulse Rate:  [59-83] 75 (05/27 0909) Resp:  [16-23] 18 (05/27 0909) BP: (125-152)/(74-96) 130/79 (05/27 0909) SpO2:  [98 %-100 %] 99 % (05/27 0909) Weight:  [90.3 kg] 90.3 kg (05/26 1336)  Physical Exam:  General: alert, cooperative, and appears stated age Lochia: appropriate Uterine Fundus: firm Incision: no significant drainage DVT Evaluation: No evidence of DVT seen on physical exam.  Recent Labs    05/27/21 0950 05/29/21 0545  HGB 13.1 11.3*  HCT 39.0 33.5*    Assessment/Plan: Status post Cesarean section. Doing well postoperatively.  Appropriate drop in Hgb, should not require additional iron supplementation Pain control Increase ambulation CBGs are good--2 hour at pp visit BP is well controlled on Procardia and Lasix Continue current care. Anticipate DC in am Breast and bottle feeding POPs  Reva Bores 05/29/2021, 9:23 AM

## 2021-05-30 MED ORDER — IBUPROFEN 600 MG PO TABS: 600.0000 mg | ORAL_TABLET | Freq: Four times a day (QID) | ORAL | 0 refills | Status: DC

## 2021-05-30 MED ORDER — ACETAMINOPHEN 500 MG PO TABS: 1000.0000 mg | ORAL_TABLET | Freq: Four times a day (QID) | ORAL | 0 refills | Status: DC

## 2021-05-30 MED ORDER — OXYCODONE HCL 5 MG PO TABS: 5.0000 mg | ORAL_TABLET | ORAL | 0 refills | Status: DC | PRN

## 2021-05-30 MED ORDER — FUROSEMIDE 20 MG PO TABS: 20.0000 mg | ORAL_TABLET | Freq: Every day | ORAL | 0 refills | Status: DC

## 2021-06-01 ENCOUNTER — Ambulatory Visit: Payer: Medicaid Other

## 2021-06-02 ENCOUNTER — Encounter: Payer: Medicaid Other | Admitting: Obstetrics & Gynecology

## 2021-06-04 ENCOUNTER — Ambulatory Visit (INDEPENDENT_AMBULATORY_CARE_PROVIDER_SITE_OTHER): Payer: Medicaid Other

## 2021-06-04 VITALS — BP 139/90 | HR 83 | Wt 184.6 lb

## 2021-06-04 DIAGNOSIS — Z5189 Encounter for other specified aftercare: Secondary | ICD-10-CM

## 2021-06-04 DIAGNOSIS — Z0130 Encounter for examination of blood pressure without abnormal findings: Secondary | ICD-10-CM

## 2021-06-04 NOTE — Progress Notes (Signed)
Blood Pressure and Incision Check Visit  Regina [name] is here for blood pressure and wound check following primary c-section on 05/28/21. Honeycomb dressing removed. Incision is clean, dry, and intact. Reviewed good wound care and s/s of infection.   BP today is 139/90. Patient reports intermittent dizziness. States she has not been eating well and has not eaten this morning. Snack given to patient prior to leaving office. Reports not taking prescribed nifedipine 30 mg daily because she thought this was discontinued. Reviewed with Vergie Living, MD who recommends patient resume nifedipine and return in 1 week for recheck. Pt agreeable to plan.  [name], RN 06/04/2021  10:32 AM

## 2021-06-09 ENCOUNTER — Ambulatory Visit: Payer: Medicaid Other

## 2021-06-11 ENCOUNTER — Ambulatory Visit: Payer: Medicaid Other

## 2021-06-15 ENCOUNTER — Ambulatory Visit: Payer: Medicaid Other

## 2021-06-25 ENCOUNTER — Telehealth: Payer: Self-pay | Admitting: General Practice

## 2021-06-25 NOTE — Telephone Encounter (Signed)
Marie a nurse from [facility] called to inform the office she had a visit to the patient's house yesterday and her BP was 140/90. The patient reported to her that she had not been taking her medication consistently because she kept forgetting to take it. Per chart review, patient had follow up visit in the office on  6/2 with elevated BP and wasn't taking her medication then either. Patient has pp visit scheduled 7/6. Will send mychart message to patient.

## 2021-07-08 ENCOUNTER — Other Ambulatory Visit (HOSPITAL_COMMUNITY)
Admission: RE | Admit: 2021-07-08 | Discharge: 2021-07-08 | Disposition: A | Discharge status: Home / Self Care | Payer: Medicaid Other | Source: Ambulatory Visit | Attending: Student | Admitting: Student

## 2021-07-08 ENCOUNTER — Encounter: Payer: Self-pay | Admitting: Student

## 2021-07-08 ENCOUNTER — Other Ambulatory Visit: Payer: Self-pay

## 2021-07-08 ENCOUNTER — Ambulatory Visit (INDEPENDENT_AMBULATORY_CARE_PROVIDER_SITE_OTHER): Payer: Medicaid Other | Admitting: Student

## 2021-07-08 DIAGNOSIS — O10919 Unspecified pre-existing hypertension complicating pregnancy, unspecified trimester: Secondary | ICD-10-CM | POA: Diagnosis not present

## 2021-07-08 DIAGNOSIS — I10 Essential (primary) hypertension: Secondary | ICD-10-CM

## 2021-07-08 MED ORDER — NIFEDIPINE ER OSMOTIC RELEASE 30 MG PO TB24: 30.0000 mg | ORAL_TABLET | Freq: Every day | ORAL | 3 refills | Status: DC

## 2021-07-08 MED ORDER — NORETHINDRONE ACET-ETHINYL EST 1.5-30 MG-MCG PO TABS: 1.0000 | ORAL_TABLET | Freq: Every day | ORAL | 11 refills | Status: DC

## 2021-07-08 NOTE — Progress Notes (Addendum)
Post Partum Visit Note  Regina [name] is a 21 y.o. [phone] female who presents for a postpartum visit. She is  5.6  weeks postpartum following a primary cesarean section.  I have fully reviewed the prenatal and intrapartum course. The delivery was at 37.1 gestational weeks.  Anesthesia: spinal. Postpartum course has been uncomplicated. Baby is doing well. Baby is feeding by breast. Bleeding moderate lochia. Bowel function is normal. Bladder function is normal. Patient is not sexually active. Contraception method is none. Postpartum depression screening: negative.  Patient has not taken her blood pressure medicine in 5 days.   The pregnancy intention screening data noted above was reviewed. Potential methods of contraception were discussed. The patient elected to proceed with upstream.   Edinburgh Postnatal Depression Scale - 07/08/21 1012       Edinburgh Postnatal Depression Scale:  In the Past 7 Days   I have been able to laugh and see the funny side of things. 0    I have looked forward with enjoyment to things. 0    I have blamed myself unnecessarily when things went wrong. 0    I have been anxious or worried for no good reason. 0    I have felt scared or panicky for no good reason. 0    Things have been getting on top of me. 0    I have been so unhappy that I have had difficulty sleeping. 0    I have felt sad or miserable. 0    I have been so unhappy that I have been crying. 0    The thought of harming myself has occurred to me. 0    Edinburgh Postnatal Depression Scale Total 0             Health Maintenance Due  Topic Date Due   URINE MICROALBUMIN  Never done   PAP-Cervical Cytology Screening  Never done   PAP SMEAR-Modifier  Never done    The following portions of the patient's history were reviewed and updated as appropriate: allergies, current medications, past family history, past medical history, past social history, past surgical history, and problem  list.  Review of Systems Pertinent items are noted in HPI.  Objective:  BP 134/85   Pulse 73   Wt 188 lb (85.3 kg)   LMP 09/10/2020 (Exact Date)   Breastfeeding Yes   BMI 33.30 kg/m    General:  alert, cooperative, and no distress   Breasts:  not indicated  Lungs: clear to auscultation bilaterally  Heart:  regular rate and rhythm, S1, S2 normal, no murmur, click, rub or gallop  Abdomen: soft, non-tender; bowel sounds normal; no masses,  no organomegaly   Wound well approximated incision  GU exam:  normal       Assessment:    Healthy  postpartum exam.   Plan:   Essential components of care per ACOG recommendations:  1.  Mood and well being: Patient with negative depression screening today. Reviewed local resources for support.  - Patient tobacco use? No.   - hx of drug use? No.    2. Infant care and feeding:  -Patient currently breastmilk feeding? Yes  -Social determinants of health (SDOH) reviewed in EPIC. No concerns. The following needs were identified  3. Sexuality, contraception and birth spacing - Patient does want a pregnancy in the next year.  Desired family size is 3 children.  - Reviewed reproductive life planning. Reviewed contraceptive methods based on pt preferences and effectiveness.  Patient desired Oral Contraceptive today.   - Discussed birth spacing of 18 months  4. Sleep and fatigue -Encouraged family/partner/community support of 4 hrs of uninterrupted sleep to help with mood and fatigue  5. Physical Recovery  - Discussed patients delivery and complications. She describes her labor as good. - Patient had a C-section, no problems at delivery. Patient had a  NA  laceration. Perineal healing reviewed. Patient expressed understanding - Patient has urinary incontinence? No. - Patient is safe to resume physical and sexual activity  6.  Health Maintenance - HM due items addressed Yes - Last pap smear No results found for: "DIAGPAP" Pap smear done at  today's visit.  -Breast Cancer screening indicated? NO  7. Chronic Disease/Pregnancy Condition follow up:  Needs GDM postpartum test  -long discussion about importance of taking BP medicine; refill given on procardia. Explained that uncontrolled BP and plus birth control can cause blood clots and stroke; she agrees to take BP meds and BC together and an appt was made for her to be seen at family practice; long discussion about how to take [facility] pills. RX sent.  -patient to also have 2 hour PP gtt  - PCP follow up: Family practice appt made for blood pressure monitoring  Marylene Land, CNM [facility] [employer], [facility]

## 2021-07-08 NOTE — Progress Notes (Signed)
Patient stated last time she took her BP Rx was last Friday 06/30.

## 2021-07-08 NOTE — Patient Instructions (Signed)
[facility] Address: [address], [place], [place] [address] Phone: [phone]

## 2021-07-09 LAB — CYTOLOGY - PAP: Diagnosis: NEGATIVE

## 2021-07-12 ENCOUNTER — Other Ambulatory Visit: Payer: Medicaid Other

## 2021-09-01 NOTE — Progress Notes (Unsigned)
   New Patient Visit  There were no vitals taken for this visit.   Subjective:    Patient ID: Regina [name], female    DOB: [birth year], 21 y.o.   MRN: [id number]  CC: No chief complaint on file.   HPI: Regina [name] is a 21 y.o. female presents for new patient visit to establish care.  Introduced to Publishing rights manager role and practice setting.  All questions answered.  Discussed provider/patient relationship and expectations.   Past Medical History:  Diagnosis Date   Chronic hypertension 09/19/2019   GDM (gestational diabetes mellitus) 07/19/2019   Gestational diabetes    History of preterm delivery 08/02/2019   G1 at [redacted]w[redacted]d due to preE   History of severe pre-eclampsia 04/02/2019   Hx of [facility] spotted fever 07/2015    Past Surgical History:  Procedure Laterality Date   CESAREAN SECTION N/A 05/28/2021   Procedure: CESAREAN SECTION;  Surgeon: Reva Bores, MD;  Location: [facility] LD ORS;  Service: Obstetrics;  Laterality: N/A;   NO PAST SURGERIES      Family History  Problem Relation Age of Onset   Hypertension Mother    Diabetes Maternal Grandmother      Social History   Tobacco Use   Smoking status: Never   Smokeless tobacco: Never  Vaping Use   Vaping Use: Never used  Substance Use Topics   Alcohol use: Never   Drug use: Never    Current Outpatient Medications on File Prior to Visit  Medication Sig Dispense Refill   acetaminophen (TYLENOL) 500 MG tablet Take 2 tablets (1,000 mg total) by mouth every 6 (six) hours. (Patient not taking: Reported on 06/04/2021) 30 tablet 0   ibuprofen (ADVIL) 600 MG tablet Take 1 tablet (600 mg total) by mouth every 6 (six) hours. (Patient not taking: Reported on 07/08/2021) 30 tablet 0   NIFEdipine (PROCARDIA XL) 30 MG 24 hr tablet Take 1 tablet (30 mg total) by mouth daily. 30 tablet 3   Norethindrone Acetate-Ethinyl Estradiol (LOESTRIN) 1.5-30 MG-MCG tablet Take 1 tablet by mouth daily. 28 tablet 11   oxyCODONE (OXY  IR/ROXICODONE) 5 MG immediate release tablet Take 1-2 tablets (5-10 mg total) by mouth every 4 (four) hours as needed for moderate pain. (Patient not taking: Reported on 07/08/2021) 20 tablet 0   Prenatal Vit-Fe Fumarate-FA (PREPLUS) 27-1 MG TABS Take 1 tablet by mouth daily. (Patient not taking: Reported on 07/08/2021) 30 tablet 11   No current facility-administered medications on file prior to visit.     Review of Systems      Objective:    There were no vitals taken for this visit.  Wt Readings from Last 3 Encounters:  07/08/21 188 lb (85.3 kg)  06/04/21 184 lb 9.6 oz (83.7 kg)  05/28/21 199 lb (90.3 kg)    BP Readings from Last 3 Encounters:  07/08/21 134/85  06/04/21 139/90  05/30/21 125/80    Physical Exam     Assessment & Plan:   Problem List Items Addressed This Visit   None    Follow up plan: No follow-ups on file.

## 2021-09-02 ENCOUNTER — Encounter: Payer: Self-pay | Admitting: Nurse Practitioner

## 2021-09-02 ENCOUNTER — Ambulatory Visit (INDEPENDENT_AMBULATORY_CARE_PROVIDER_SITE_OTHER): Payer: Medicaid Other | Admitting: Nurse Practitioner

## 2021-09-02 ENCOUNTER — Other Ambulatory Visit (HOSPITAL_COMMUNITY)
Admission: RE | Admit: 2021-09-02 | Discharge: 2021-09-02 | Disposition: A | Discharge status: Home / Self Care | Payer: Medicaid Other | Source: Ambulatory Visit | Attending: Nurse Practitioner | Admitting: Nurse Practitioner

## 2021-09-02 VITALS — BP 144/100 | HR 73 | Temp 97.0°F | Ht 62.0 in | Wt 188.2 lb

## 2021-09-02 DIAGNOSIS — E669 Obesity, unspecified: Secondary | ICD-10-CM | POA: Diagnosis not present

## 2021-09-02 DIAGNOSIS — I10 Essential (primary) hypertension: Secondary | ICD-10-CM | POA: Diagnosis not present

## 2021-09-02 DIAGNOSIS — Z23 Encounter for immunization: Secondary | ICD-10-CM

## 2021-09-02 DIAGNOSIS — Z8632 Personal history of gestational diabetes: Secondary | ICD-10-CM | POA: Diagnosis not present

## 2021-09-02 DIAGNOSIS — N898 Other specified noninflammatory disorders of vagina: Secondary | ICD-10-CM | POA: Insufficient documentation

## 2021-09-02 HISTORY — DX: Obesity, unspecified: E66.9

## 2021-09-02 LAB — COMPREHENSIVE METABOLIC PANEL
ALT: 24 U/L (ref 0–35)
AST: 22 U/L (ref 0–37)
Albumin: 4.3 g/dL (ref 3.5–5.2)
Alkaline Phosphatase: 54 U/L (ref 39–117)
BUN: 13 mg/dL (ref 6–23)
CO2: 25 mEq/L (ref 19–32)
Calcium: 9.2 mg/dL (ref 8.4–10.5)
Chloride: 104 mEq/L (ref 96–112)
Creatinine, Ser: 0.62 mg/dL (ref 0.40–1.20)
GFR: 127.14 mL/min (ref >60.00)
Glucose, Bld: 93 mg/dL (ref 70–99)
Potassium: 3.8 mEq/L (ref 3.5–5.1)
Sodium: 137 mEq/L (ref 135–145)
Total Bilirubin: 0.4 mg/dL (ref 0.2–1.2)
Total Protein: 7.9 g/dL (ref 6.0–8.3)

## 2021-09-02 LAB — CBC WITH DIFFERENTIAL/PLATELET
Basophils Absolute: 0 10*3/uL (ref 0.0–0.1)
Basophils Relative: 0.3 % (ref 0.0–3.0)
Eosinophils Absolute: 0.1 10*3/uL (ref 0.0–0.7)
Eosinophils Relative: 1.2 % (ref 0.0–5.0)
HCT: 41.4 % (ref 36.0–46.0)
Hemoglobin: 14 g/dL (ref 12.0–15.0)
Lymphocytes Relative: 39.7 % (ref 12.0–46.0)
Lymphs Abs: 2 10*3/uL (ref 0.7–4.0)
MCHC: 33.7 g/dL (ref 30.0–36.0)
MCV: 88.2 fl (ref 78.0–100.0)
Monocytes Absolute: 0.5 10*3/uL (ref 0.1–1.0)
Monocytes Relative: 9.3 % (ref 3.0–12.0)
Neutro Abs: 2.5 10*3/uL (ref 1.4–7.7)
Neutrophils Relative %: 49.5 % (ref 43.0–77.0)
Platelets: 268 10*3/uL (ref 150.0–400.0)
RBC: 4.7 Mil/uL (ref 3.87–5.11)
RDW: 13.2 % (ref 11.5–15.5)
WBC: 5.1 10*3/uL (ref 4.0–10.5)

## 2021-09-02 LAB — LIPID PANEL
Cholesterol: 217 mg/dL — ABNORMAL HIGH (ref 0–200)
HDL: 47.4 mg/dL (ref >39.00)
LDL Cholesterol: 150 mg/dL — ABNORMAL HIGH (ref 0–99)
NonHDL: 169.12
Total CHOL/HDL Ratio: 5
Triglycerides: 94 mg/dL (ref 0.0–149.0)
VLDL: 18.8 mg/dL (ref 0.0–40.0)

## 2021-09-02 LAB — HEMOGLOBIN A1C: Hgb A1c MFr Bld: 5.5 % (ref 4.6–6.5)

## 2021-09-02 MED ORDER — NIFEDIPINE ER OSMOTIC RELEASE 30 MG PO TB24: 30.0000 mg | ORAL_TABLET | Freq: Every day | ORAL | 1 refills | Status: DC

## 2021-09-02 MED ORDER — LORATADINE 10 MG PO TABS: 10.0000 mg | ORAL_TABLET | Freq: Every day | ORAL | 3 refills | Status: DC

## 2021-09-02 NOTE — Patient Instructions (Signed)
It was great to see you!  Start nifedipine 1 tablet daily.   We are checking your labs today and will let you know the results via mychart/phone.   Let's follow-up in 2-3 weeks, sooner if you have concerns.  If a referral was placed today, you will be contacted for an appointment. Please note that routine referrals can sometimes take up to 3-4 weeks to process. Please call our office if you haven't heard anything after this time frame.  Take care,  Rodman Pickle, NP

## 2021-09-02 NOTE — Assessment & Plan Note (Addendum)
Blood pressure elevated today at 144/100.  She has been having elevated blood pressures since delivering her baby even at the GYN.  We will start her on nifedipine 30 mg daily.  Encouraged her to check her blood pressure at home.  Discussed nutrition, exercise.  Check CMP, CBC, lipid panel.  Follow-up in 2 to 3 weeks.

## 2021-09-02 NOTE — Assessment & Plan Note (Signed)
BMI 34.4.  Discussed nutrition, exercise.

## 2021-09-02 NOTE — Assessment & Plan Note (Signed)
She has a history of gestational diabetes with 2 of her pregnancies, we will check an A1c today

## 2021-09-03 ENCOUNTER — Ambulatory Visit (INDEPENDENT_AMBULATORY_CARE_PROVIDER_SITE_OTHER): Payer: Medicaid Other | Admitting: Primary Care

## 2021-09-03 ENCOUNTER — Encounter: Payer: Self-pay | Admitting: Nurse Practitioner

## 2021-09-03 LAB — CERVICOVAGINAL ANCILLARY ONLY
Bacterial Vaginitis (gardnerella): POSITIVE — AB
Candida Glabrata: NEGATIVE
Candida Vaginitis: NEGATIVE
Chlamydia: NEGATIVE
Comment: NEGATIVE
Comment: NEGATIVE
Comment: NEGATIVE
Comment: NEGATIVE
Comment: NORMAL
Neisseria Gonorrhea: NEGATIVE

## 2021-09-03 MED ORDER — CLINDAMYCIN HCL 300 MG PO CAPS: 300.0000 mg | ORAL_CAPSULE | Freq: Two times a day (BID) | ORAL | 0 refills | Status: DC

## 2021-09-03 NOTE — Addendum Note (Signed)
Addended by: Rodman Pickle A on: 09/03/2021 12:39 PM   Modules accepted: Orders

## 2021-09-22 NOTE — Progress Notes (Unsigned)
LMP 08/28/2021 (Approximate)    Subjective:    Patient ID: Regina [name], female    DOB: 16-Aug-2000, 21 y.o.   MRN: [id number]  CC: No chief complaint on file.  HPI: Regina [name] is a 21 y.o. female presenting on 09/23/2021 for comprehensive medical examination. Current medical complaints include: hypertension  Last visit she was started on nifedipine 30mg  daily.   She currently lives with: Menopausal Symptoms: {Blank single:19197::"yes","no"}  Depression Screen done today and results listed below:     09/02/2021   11:50 AM 05/25/2021   10:17 AM 05/11/2021    9:05 AM 05/03/2021    8:24 AM 04/23/2021   12:09 PM  Depression screen PHQ 2/9  Decreased Interest 0 0 0 1 0  Down, Depressed, Hopeless 0 0 0 0 0  PHQ - 2 Score 0 0 0 1 0  Altered sleeping  0 0 1 0  Tired, decreased energy  0 0 1 0  Change in appetite  0 0 1 0  Feeling bad or failure about yourself   0 0 0 0  Trouble concentrating  0 0 0 0  Moving slowly or fidgety/restless  0 0 1 0  Suicidal thoughts  0 0 0 0  PHQ-9 Score  0 0 5 0    The patient {has/does not have:19849} a history of falls. I {did/did not:19850} complete a risk assessment for falls. A plan of care for falls {was/was not:19852} documented.   Past Medical History:  Past Medical History:  Diagnosis Date   Chronic hypertension 09/19/2019   GDM (gestational diabetes mellitus) 07/19/2019   History of preterm delivery 08/02/2019   G1 at [redacted]w[redacted]d due to preE   History of severe pre-eclampsia 04/02/2019   Hx of [facility] spotted fever 07/2015    Surgical History:  Past Surgical History:  Procedure Laterality Date   CESAREAN SECTION N/A 05/28/2021   Procedure: CESAREAN SECTION;  Surgeon: Donnamae Jude, MD;  Location: [facility];  Service: Obstetrics;  Laterality: N/A;    Medications:  Current Outpatient Medications on File Prior to Visit  Medication Sig   clindamycin (CLEOCIN) 300 MG capsule Take 1 capsule (300 mg total) by mouth in the morning  and at bedtime.   loratadine (CLARITIN) 10 MG tablet Take 1 tablet (10 mg total) by mouth daily.   NIFEdipine (PROCARDIA-XL/NIFEDICAL-XL) 30 MG 24 hr tablet Take 1 tablet (30 mg total) by mouth daily.   No current facility-administered medications on file prior to visit.    Allergies:  No Known Allergies  Social History:   Social History   Tobacco Use  Smoking Status Never  Smokeless Tobacco Never   Social History   Substance and Sexual Activity  Alcohol Use Never    Family History:  Family History  Problem Relation Age of Onset   Hypertension Mother    Diabetes Maternal Grandmother     Past medical history, surgical history, medications, allergies, family history and social history reviewed with patient today and changes made to appropriate areas of the chart.   ROS All other ROS negative except what is listed above and in the HPI.      Objective:    LMP 08/28/2021 (Approximate)   Wt Readings from Last 3 Encounters:  09/02/21 188 lb 3.2 oz (85.4 kg)  07/08/21 188 lb (85.3 kg)  06/04/21 184 lb 9.6 oz (83.7 kg)    Physical Exam  Results for orders placed or performed in visit on 09/02/21  CBC with Differential/Platelet  Result Value Ref Range   WBC 5.1 4.0 - 10.5 K/uL   RBC 4.70 3.87 - 5.11 Mil/uL   Hemoglobin 14.0 12.0 - 15.0 g/dL   HCT 41.4 36.0 - 46.0 %   MCV 88.2 78.0 - 100.0 fl   MCHC 33.7 30.0 - 36.0 g/dL   RDW 13.2 11.5 - 15.5 %   Platelets 268.0 150.0 - 400.0 K/uL   Neutrophils Relative % 49.5 43.0 - 77.0 %   Lymphocytes Relative 39.7 12.0 - 46.0 %   Monocytes Relative 9.3 3.0 - 12.0 %   Eosinophils Relative 1.2 0.0 - 5.0 %   Basophils Relative 0.3 0.0 - 3.0 %   Neutro Abs 2.5 1.4 - 7.7 K/uL   Lymphs Abs 2.0 0.7 - 4.0 K/uL   Monocytes Absolute 0.5 0.1 - 1.0 K/uL   Eosinophils Absolute 0.1 0.0 - 0.7 K/uL   Basophils Absolute 0.0 0.0 - 0.1 K/uL  Comprehensive metabolic panel  Result Value Ref Range   Sodium 137 135 - 145 mEq/L   Potassium  3.8 3.5 - 5.1 mEq/L   Chloride 104 96 - 112 mEq/L   CO2 25 19 - 32 mEq/L   Glucose, Bld 93 70 - 99 mg/dL   BUN 13 6 - 23 mg/dL   Creatinine, Ser 0.62 0.40 - 1.20 mg/dL   Total Bilirubin 0.4 0.2 - 1.2 mg/dL   Alkaline Phosphatase 54 39 - 117 U/L   AST 22 0 - 37 U/L   ALT 24 0 - 35 U/L   Total Protein 7.9 6.0 - 8.3 g/dL   Albumin 4.3 3.5 - 5.2 g/dL   GFR 127.14 >60.00 mL/min   Calcium 9.2 8.4 - 10.5 mg/dL  Hemoglobin A1c  Result Value Ref Range   Hgb A1c MFr Bld 5.5 4.6 - 6.5 %  Lipid panel  Result Value Ref Range   Cholesterol 217 (H) 0 - 200 mg/dL   Triglycerides 94.0 0.0 - 149.0 mg/dL   HDL 47.40 >39.00 mg/dL   VLDL 18.8 0.0 - 40.0 mg/dL   LDL Cholesterol 150 (H) 0 - 99 mg/dL   Total CHOL/HDL Ratio 5    NonHDL 169.12   Cervicovaginal ancillary only  Result Value Ref Range   Neisseria Gonorrhea Negative    Chlamydia Negative    Bacterial Vaginitis (gardnerella) Positive (A)    Candida Vaginitis Negative    Candida Glabrata Negative    Comment      Normal Reference Range Bacterial Vaginosis - Negative   Comment Normal Reference Range Candida Species - Negative    Comment Normal Reference Range Candida Galbrata - Negative    Comment Normal Reference Ranger Chlamydia - Negative    Comment      Normal Reference Range Neisseria Gonorrhea - Negative      Assessment & Plan:   Problem List Items Addressed This Visit   None    Follow up plan: No follow-ups on file.   LABORATORY TESTING:  - Pap smear: up to date  IMMUNIZATIONS:   - Tdap: Tetanus vaccination status reviewed: last tetanus booster within 10 years. - Influenza: Up to date - Pneumovax: Not applicable - Prevnar: Not applicable - HPV: Up to date - Zostavax vaccine: Not applicable  SCREENING: -Mammogram: Not applicable  - Colonoscopy: Not applicable  - Bone Density: Not applicable  -Hearing Test: Not applicable  -Spirometry: Not applicable   PATIENT COUNSELING:   Advised to take 1 mg of folate  supplement per day if  capable of pregnancy.   Sexuality: Discussed sexually transmitted diseases, partner selection, use of condoms, avoidance of unintended pregnancy  and contraceptive alternatives.   Advised to avoid cigarette smoking.  I discussed with the patient that most people either abstain from alcohol or drink within safe limits (<=14/week and <=4 drinks/occasion for males, <=7/weeks and <= 3 drinks/occasion for females) and that the risk for alcohol disorders and other health effects rises proportionally with the number of drinks per week and how often a drinker exceeds daily limits.  Discussed cessation/primary prevention of drug use and availability of treatment for abuse.   Diet: Encouraged to adjust caloric intake to maintain  or achieve ideal body weight, to reduce intake of dietary saturated fat and total fat, to limit sodium intake by avoiding high sodium foods and not adding table salt, and to maintain adequate dietary potassium and calcium preferably from fresh fruits, vegetables, and low-fat dairy products.    stressed the importance of regular exercise  Injury prevention: Discussed safety belts, safety helmets, smoke detector, smoking near bedding or upholstery.   Dental health: Discussed importance of regular tooth brushing, flossing, and dental visits.    NEXT PREVENTATIVE PHYSICAL DUE IN 1 YEAR. No follow-ups on file.

## 2021-09-23 ENCOUNTER — Encounter: Payer: Self-pay | Admitting: Nurse Practitioner

## 2021-09-23 ENCOUNTER — Ambulatory Visit (INDEPENDENT_AMBULATORY_CARE_PROVIDER_SITE_OTHER): Payer: Medicaid Other | Admitting: Nurse Practitioner

## 2021-09-23 VITALS — BP 134/87 | HR 83 | Temp 97.7°F | Wt 188.8 lb

## 2021-09-23 DIAGNOSIS — I10 Essential (primary) hypertension: Secondary | ICD-10-CM | POA: Diagnosis not present

## 2021-09-23 DIAGNOSIS — Z0000 Encounter for general adult medical examination without abnormal findings: Secondary | ICD-10-CM | POA: Diagnosis not present

## 2021-09-23 NOTE — Patient Instructions (Signed)
It was great to see you!  I have refilled your blood pressure medication.   Let's follow-up in 6 months, sooner if you have concerns.  If a referral was placed today, you will be contacted for an appointment. Please note that routine referrals can sometimes take up to 3-4 weeks to process. Please call our office if you haven't heard anything after this time frame.  Take care,  Vance Peper, NP

## 2021-09-23 NOTE — Assessment & Plan Note (Signed)
Chronic, stable. BP today improved to 134/87. Continue nifedipine 30mg  daily. Follow-up in 6 months.

## 2021-09-25 IMAGING — US US MFM FETAL BPP W/O NON-STRESS
1 series · 13 of 28 positions shown · non-contrast
Comparison: none

[Series 1: us mfm fetal bpp w/o non-stress · 63 acquisitions, 13 frames shown]
[im 3/63]
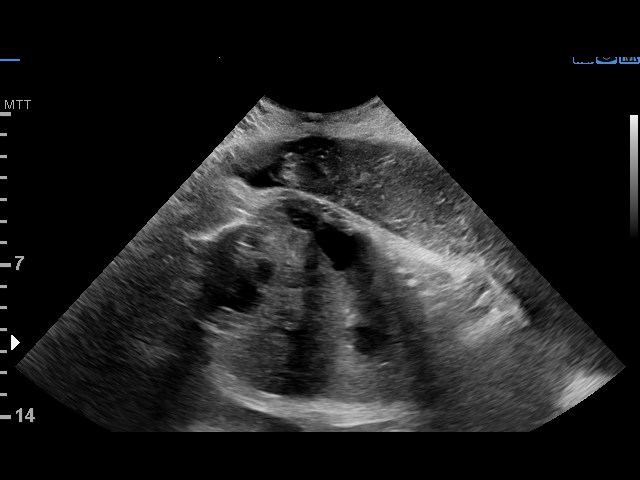
[im 7/63]
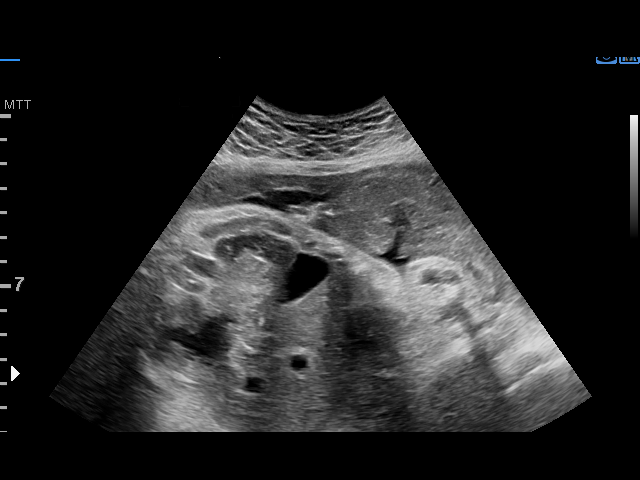
[im 12/63]
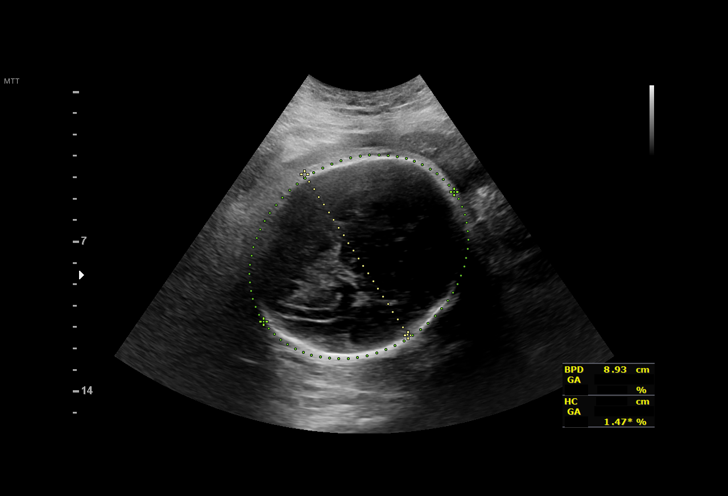
[im 17/63]
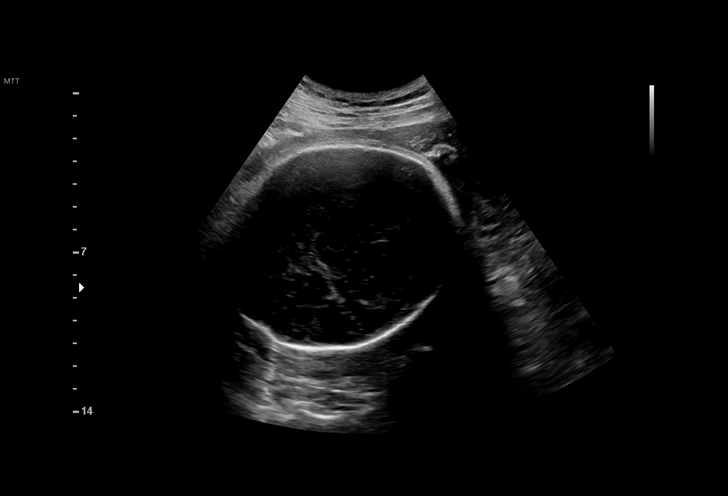
[im 21/63]
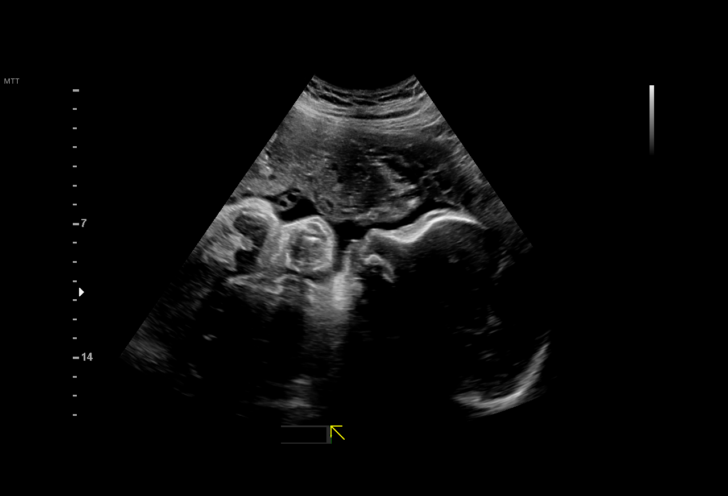
[im 26/63]
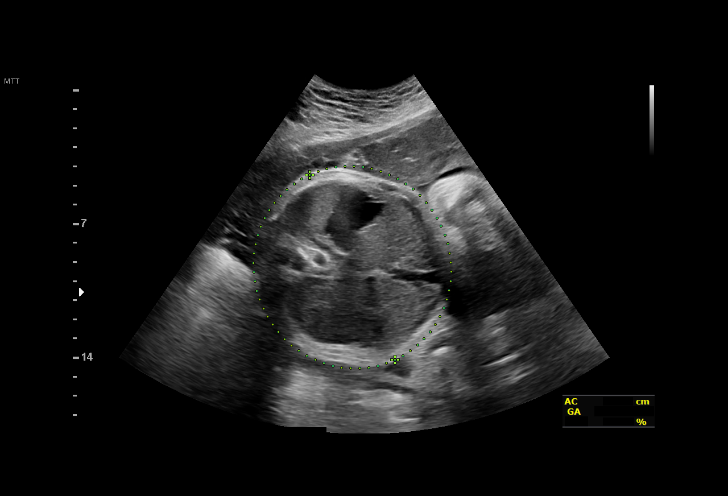
[im 33/63]
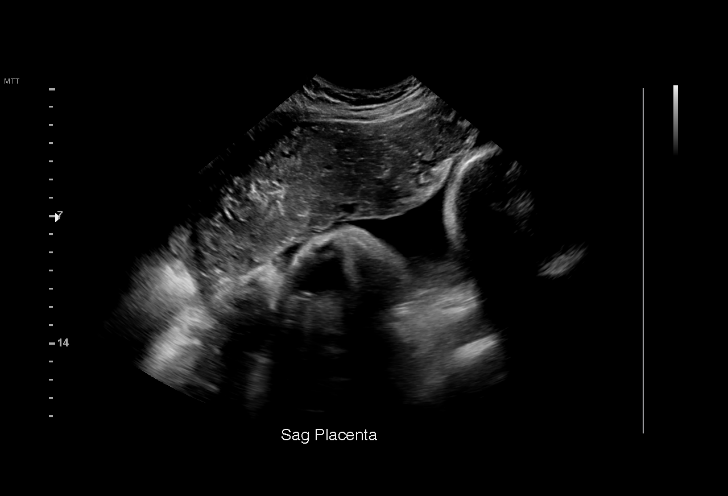
[im 37/63]
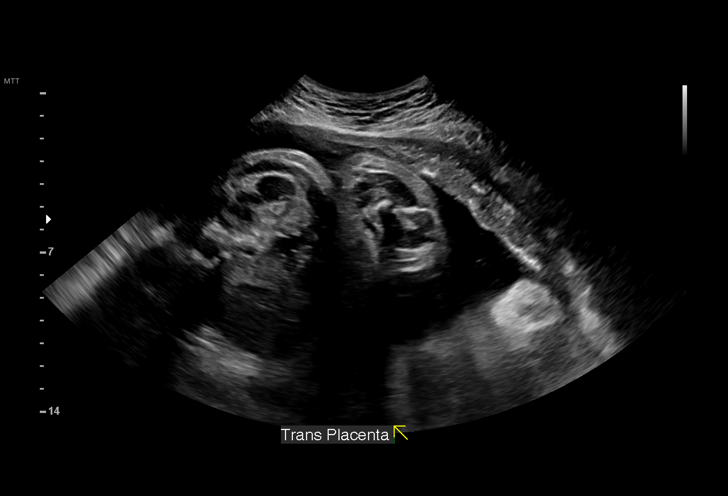
[im 42/63]
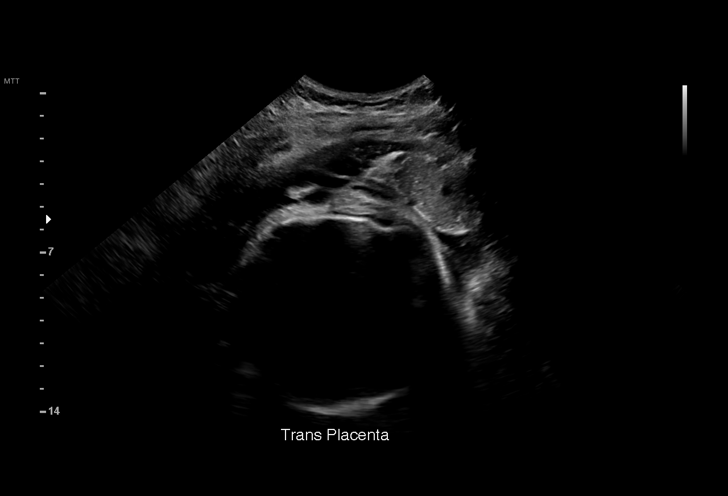
[im 46/63]
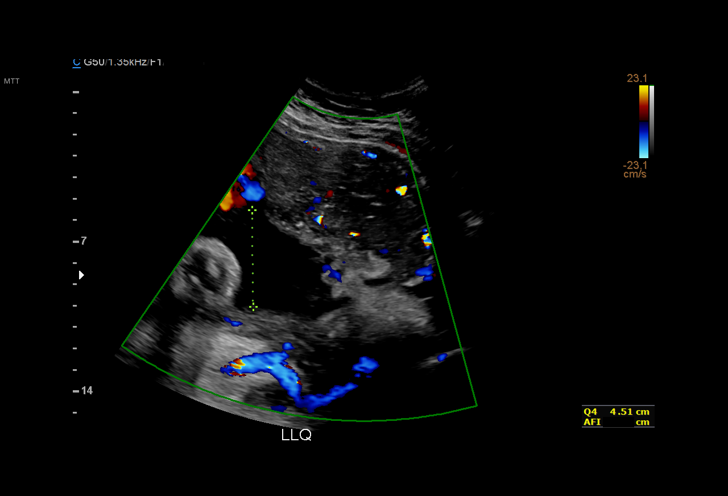
[im 51/63]
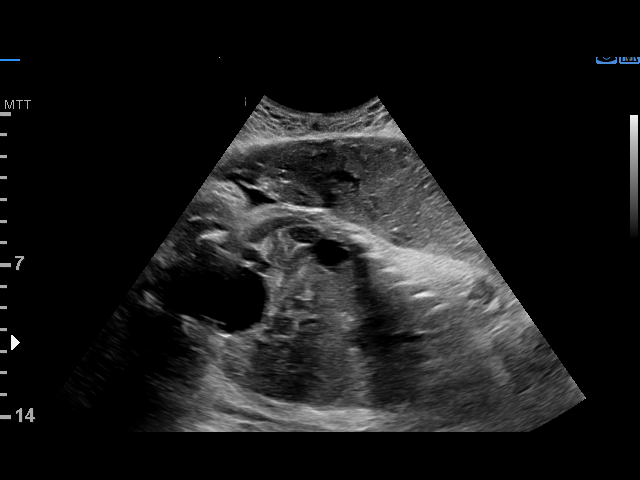
[im 56/63]
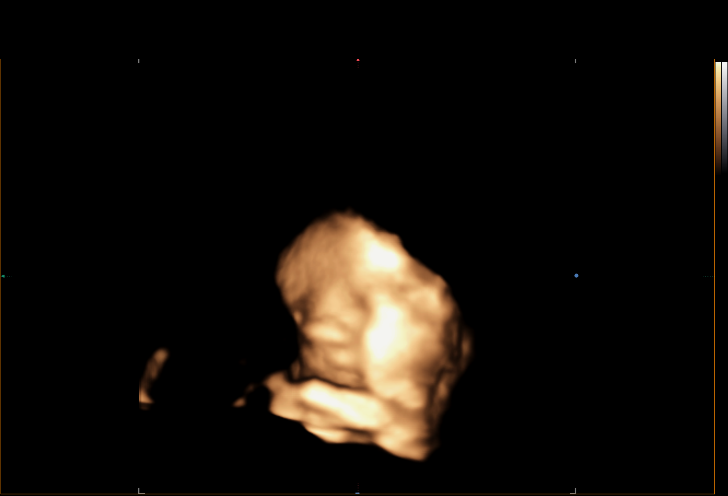
[im 60/63]
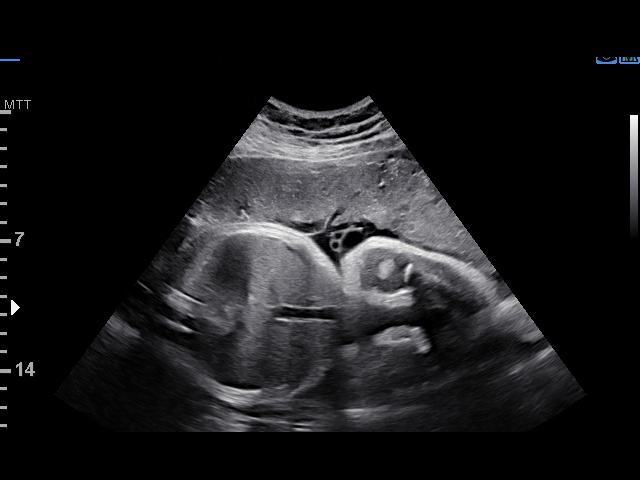

[13 of 28 positions shown; findings below may reference images not displayed]

Attending:        Christyna Doratiotto        Secondary Phy.:   JUARBE
                                                            CORTIS MOTO CNM

                                                      JUMPER

Indications

 36 weeks gestation of pregnancy
 Hypertension - (Labetalol)
 Low risk NIPS; Horizon Neg screening
 Poor obstetric history: Previous
 preeclampsia / eclampsia/gestational HTN
 Poor obstetric history: Previous preterm
 delivery, antepartum (36 w4d)
 Gestational diabetes in pregnancy, diet
 controlled
Fetal Evaluation

 Num Of Fetuses:         1
 Fetal Heart Rate(bpm):  153
 Cardiac Activity:       Observed
 Presentation:           Cephalic
 Placenta:               Anterior
 P. Cord Insertion:      Previously Visualized

 Amniotic Fluid
 AFI FV:      Within normal limits

 AFI Sum(cm)     %Tile       Largest Pocket(cm)
 19.86           76
 RUQ(cm)       RLQ(cm)       LUQ(cm)        LLQ(cm)

Biophysical Evaluation

 Amniotic F.V:   Pocket => 2 cm             F. Tone:        Observed
 F. Movement:    Observed                   Score:          [DATE]
 F. Breathing:   Observed
Biometry

 BPD:      89.7  mm     G. Age:  36w 2d         52  %    CI:        82.66   %    70 - 86
                                                         FL/HC:      21.8   %    20.8 -
 HC:      311.2  mm     G. Age:  34w 6d        1.9  %    HC/AC:      0.96        0.92 -
 AC:      324.8  mm     G. Age:  36w 3d         54  %    FL/BPD:     75.6   %    71 - 87
 FL:       67.8  mm     G. Age:  34w 6d          9  %    FL/AC:      20.9   %    20 - 24
 LV:        5.4  mm

 Est. FW:    9441  gm      6 lb 2 oz     30  %
OB History

 Gravidity:    2         Term:   1        Prem:   0        SAB:   0
 TOP:          0       Ectopic:  0        Living: 1
Gestational Age

 LMP:           43w 2d        Date:  11/13/18                 EDD:   08/20/19
 U/S Today:     35w 4d                                        EDD:   10/13/19
 Best:          36w 5d     Det. By:  U/S C R L  (04/02/19)    EDD:   10/05/19
Anatomy

 Cranium:               Previously seen        Aortic Arch:            Previously seen
 Cavum:                 Previously seen        Ductal Arch:            Previously seen
 Ventricles:            Appears normal         Diaphragm:              Appears normal
 Choroid Plexus:        Previously seen        Stomach:                Appears normal, left
                                                                       sided
 Cerebellum:            Previously seen        Abdomen:                Previously seen
 Posterior Fossa:       Previously seen        Abdominal Wall:         Previously seen
 Nuchal Fold:           Previously seen        Cord Vessels:           Previously seen
 Face:                  Orbits and profile     Kidneys:                Appear normal
                        previously seen
 Lips:                  Previously seen        Bladder:                Appears normal
 Thoracic:              Previously seen        Spine:                  Limited views
                                                                       previously seen
 Heart:                 Previously seen        Upper Extremities:      Previously seen
 RVOT:                  Previously seen        Lower Extremities:      Previously seen
 LVOT:                  Previously seen

 Other:  Right heel visualized previously. Technically difficult due to fetal
         position.
Cervix Uterus Adnexa

 Cervix
 Not visualized (advanced GA >42wks)
 Uterus
 No abnormality visualized.

 Right Ovary
 Within normal limits.

 Left Ovary
 Within normal limits.

 Cul De Sac
 No free fluid seen.

 Adnexa
 No abnormality visualized.
Impression

 Patient with chronic hypertension return for fetal growth
 assessment and antenatal testing.  She has been having
 intermittent blurry vision and headache (not relieved by
 Tylenol.  She also reported sharp intermittent epigastric pain.
 Patient had a prenatal visit appointment yesterday and
 informed that her blood work could not be performed because
 a power outage.

 Fetal growth is appropriate for gestational age.Antenatal
 testing is reassuring. BPP [DATE]. Amniotic fluid is normal and
 good fetal activity is seen .  Cephalic presentation.

 I discussed the possibility of superimposed preeclampsia and
 recommended series of blood pressure readings and labs to
 be performed at the [facility].
 Patient agreed to go the [facility].
 [facility] team was informed.
Recommendations

 -BPP next week if undelivered. Delivery at 37 weeks if
 superimposed preeclampsia is diagnosed.
 -Consider delivery at 38 weeks if intermittent symptoms are
 present.
                 Questell, Yohshua

## 2021-10-19 ENCOUNTER — Encounter: Payer: Self-pay | Admitting: Nurse Practitioner

## 2021-11-02 ENCOUNTER — Telehealth (INDEPENDENT_AMBULATORY_CARE_PROVIDER_SITE_OTHER): Payer: Medicaid Other | Admitting: Nurse Practitioner

## 2021-11-02 ENCOUNTER — Encounter: Payer: Self-pay | Admitting: Nurse Practitioner

## 2021-11-02 DIAGNOSIS — G43009 Migraine without aura, not intractable, without status migrainosus: Secondary | ICD-10-CM | POA: Insufficient documentation

## 2021-11-02 HISTORY — DX: Migraine without aura, not intractable, without status migrainosus: G43.009

## 2021-11-02 NOTE — Progress Notes (Signed)
[facility] [address] [place] [address] Dept: [phone] Dept Fax: [phone]  Virtual Video Visit  I connected with Regina [name] on 11/02/21 at  8:00 AM EDT by a video enabled telemedicine application and verified that I am speaking with the correct person using two identifiers.  Location patient: Home Location provider: Clinic Persons participating in the virtual visit: Patient; Vance Peper, NP; Marchia Bond, CMA  I discussed the limitations of evaluation and management by telemedicine and the availability of in person appointments. The patient expressed understanding and agreed to proceed.  Chief Complaint  Patient presents with   Headache    From Thursday through Saturday    SUBJECTIVE:  HPI: Regina [name] is a 21 y.o. female who presents with a headache that started last Thursday that lasted until Saturday. The pain was located on the left side of her head. This was associated with dizziness and nausea, sensitivity to bright lights and loud sounds. She has not experienced this before. She describes the pain as sharp and aching. She denies a headache currently. Moving made the pain worse. She took tylenol which helped for a short period of time and then the pain came back. She denies nasal congestion, sore throat, fever, confusion, and weakness.   Patient Active Problem List   Diagnosis Date Noted   Migraine without aura and without status migrainosus, not intractable 11/02/2021   Obesity (BMI 30-39.9) 09/02/2021   History of gestational diabetes 02/19/2021   Primary hypertension 09/19/2019    Past Surgical History:  Procedure Laterality Date   CESAREAN SECTION N/A 05/28/2021   Procedure: CESAREAN SECTION;  Surgeon: Donnamae Jude, MD;  Location: [facility];  Service: Obstetrics;  Laterality: N/A;    Family History  Problem Relation Age of Onset   Hypertension Mother    Diabetes Maternal  Grandmother     Social History   Tobacco Use   Smoking status: Never   Smokeless tobacco: Never  Vaping Use   Vaping Use: Never used  Substance Use Topics   Alcohol use: Never   Drug use: Never     Current Outpatient Medications:    loratadine (CLARITIN) 10 MG tablet, Take 1 tablet (10 mg total) by mouth daily. (Patient not taking: Reported on 09/23/2021), Disp: 90 tablet, Rfl: 3   NIFEdipine (PROCARDIA-XL/NIFEDICAL-XL) 30 MG 24 hr tablet, Take 1 tablet (30 mg total) by mouth daily., Disp: 90 tablet, Rfl: 1  No Known Allergies  ROS: See pertinent positives and negatives per HPI.  OBSERVATIONS/OBJECTIVE:  VITALS per patient if applicable: There were no vitals filed for this visit. There is no height or weight on file to calculate BMI.    GENERAL: Alert and oriented. Appears well and in no acute distress.  HEENT: Atraumatic. Conjunctiva clear. No obvious abnormalities on inspection of external nose and ears.  NECK: Normal movements of the head and neck.  LUNGS: On inspection, no signs of respiratory distress. Breathing rate appears normal. No obvious gross SOB, gasping or wheezing, and no conversational dyspnea.  CV: No obvious cyanosis.  MS: Moves all visible extremities without noticeable abnormality.  PSYCH/NEURO: Pleasant and cooperative. No obvious depression or anxiety. Speech and thought processing grossly intact.  ASSESSMENT AND PLAN:  Problem List Items Addressed This Visit       Cardiovascular and Mediastinum   Migraine without aura and without status migrainosus, not intractable - Primary    She experienced a headache for 3 days, most  consistent with a migraine headache.  Discussed that if this comes back, she can try Excedrin Migraine, ibuprofen, or Tylenol to help with the pain.  Discussed that there can be triggers to migraine headaches.  Make sure that she is drinking plenty of fluids.  Follow-up with headaches are occurring more frequently or do not  improve with over-the-counter medication.        I discussed the assessment and treatment plan with the patient. The patient was provided an opportunity to ask questions and all were answered. The patient agreed with the plan and demonstrated an understanding of the instructions.   The patient was advised to call back or seek an in-person evaluation if the symptoms worsen or if the condition fails to improve as anticipated.   Charyl Dancer, NP

## 2021-11-02 NOTE — Patient Instructions (Signed)
It was great to see you!  Your headache sounds like a migraine headache.  These headaches can come and go.  You can try Excedrin Migraine, ibuprofen, or Tylenol as needed for pain.  Please follow-up if your headaches are more frequent, do not improve with over-the-counter medication. Make sure you are drinking plenty of fluids.    Take care,  Vance Peper, NP

## 2021-11-02 NOTE — Assessment & Plan Note (Signed)
She experienced a headache for 3 days, most consistent with a migraine headache.  Discussed that if this comes back, she can try Excedrin Migraine, ibuprofen, or Tylenol to help with the pain.  Discussed that there can be triggers to migraine headaches.  Make sure that she is drinking plenty of fluids.  Follow-up with headaches are occurring more frequently or do not improve with over-the-counter medication.

## 2021-11-11 ENCOUNTER — Encounter: Payer: Self-pay | Admitting: Nurse Practitioner

## 2021-11-29 ENCOUNTER — Encounter: Payer: Self-pay | Admitting: Nurse Practitioner

## 2021-11-29 ENCOUNTER — Ambulatory Visit (INDEPENDENT_AMBULATORY_CARE_PROVIDER_SITE_OTHER): Payer: Medicaid Other | Admitting: Nurse Practitioner

## 2021-11-29 VITALS — BP 132/85 | HR 80 | Temp 97.2°F | Ht 62.0 in | Wt 188.4 lb

## 2021-11-29 DIAGNOSIS — M79672 Pain in left foot: Secondary | ICD-10-CM

## 2021-11-29 DIAGNOSIS — Z8669 Personal history of other diseases of the nervous system and sense organs: Secondary | ICD-10-CM

## 2021-11-29 MED ORDER — NAPROXEN 500 MG PO TABS: 500.0000 mg | ORAL_TABLET | Freq: Two times a day (BID) | ORAL | 0 refills | Status: DC

## 2021-11-29 MED ORDER — IBUPROFEN 600 MG PO TABS: 600.0000 mg | ORAL_TABLET | Freq: Three times a day (TID) | ORAL | 0 refills | Status: DC | PRN

## 2021-11-29 NOTE — Patient Instructions (Addendum)
It was great to see you!  You can use over the counter allergy eye drops as needed for itching and irritation.   I have attached some stretches to do daily for your foot. Start ibuprofen 600mg  3 times a day with food for the next 2 weeks and then as needed for pain.   Let's follow-up if your symptoms worsen or any concerns.   Take care,  , NP

## 2021-11-29 NOTE — Progress Notes (Signed)
Acute Office Visit  Subjective:     Patient ID: Regina [name], female    DOB: October 02, 2000, 21 y.o.   MRN: [id number]  Chief Complaint  Patient presents with   Acute Visit    Pt c/o right eye lid irration left foot pain x 3 days     HPI Patient is in today for right eye redness and itching for the last 5 days. She denies pain, injury, and changes in vision. She has not tried anything for this. She is also having nasal congestion with allergies. Symptoms have improved over the past few days.   She also notices left foot pain for 3 days. She states that when she walks, she has a sharp pain. She then states that it aches while sitting. She tried massaging and using castor oil which helped for a little bit. The pain does not radiate. She denies injury.   ROS See pertinent positives and negatives per HPI.     Objective:    BP 132/85 (BP Location: Right Arm, Patient Position: Sitting, Cuff Size: Normal)   Pulse 80   Temp (!) 97.2 F (36.2 C) (Temporal)   Ht 5\' 2"  (1.575 m)   Wt 188 lb 6.4 oz (85.5 kg)   SpO2 (!) 79%   BMI 34.46 kg/m    Physical Exam Vitals and nursing note reviewed.  Constitutional:      General: She is not in acute distress.    Appearance: Normal appearance.  HENT:     Head: Normocephalic.  Eyes:     General: Lids are normal. Vision grossly intact.        Right eye: No discharge.        Left eye: No discharge.     Conjunctiva/sclera: Conjunctivae normal.     Right eye: Right conjunctiva is not injected.     Left eye: Left conjunctiva is not injected.  Pulmonary:     Effort: Pulmonary effort is normal.  Musculoskeletal:        General: Tenderness (left medial foot) present. No swelling or deformity.     Cervical back: Normal range of motion.  Skin:    General: Skin is warm.  Neurological:     General: No focal deficit present.     Mental Status: She is alert and oriented to person, place, and time.  Psychiatric:        Mood and Affect: Mood  normal.        Behavior: Behavior normal.        Thought Content: Thought content normal.        Judgment: Judgment normal.       Assessment & Plan:   Problem List Items Addressed This Visit   None Visit Diagnoses     Left foot pain    -  Primary   Acute, no injury. Most likely tendonitis. Stretches given to do daily. Can use heat/ice and ibuprofen 600mg  TID prn pain. F/U if not improving.   History of itching of eye       Right eye was itching and red, improved now. With congestion, may have been allergic vs viral conjuncitivitis. Symptoms currently resolved.       Meds ordered this encounter  Medications   DISCONTD: naproxen (NAPROSYN) 500 MG tablet    Sig: Take 1 tablet (500 mg total) by mouth 2 (two) times daily with a meal.    Dispense:  30 tablet    Refill:  0   ibuprofen (ADVIL) 600  MG tablet    Sig: Take 1 tablet (600 mg total) by mouth every 8 (eight) hours as needed.    Dispense:  30 tablet    Refill:  0    Return if symptoms worsen or fail to improve.  Gerre Scull, NP

## 2022-01-09 ENCOUNTER — Encounter: Payer: Self-pay | Admitting: Nurse Practitioner

## 2022-01-14 ENCOUNTER — Ambulatory Visit: Payer: Medicaid Other | Admitting: Nurse Practitioner

## 2022-03-24 ENCOUNTER — Ambulatory Visit: Payer: Medicaid Other | Admitting: Nurse Practitioner

## 2023-05-24 IMAGING — US US MFM OB FOLLOW-UP
1 series · 13 of 28 positions shown · non-contrast
Comparison: none

[Series 1: us mfm ob follow-up · 42 acquisitions, 13 frames shown]
[im 2/42]
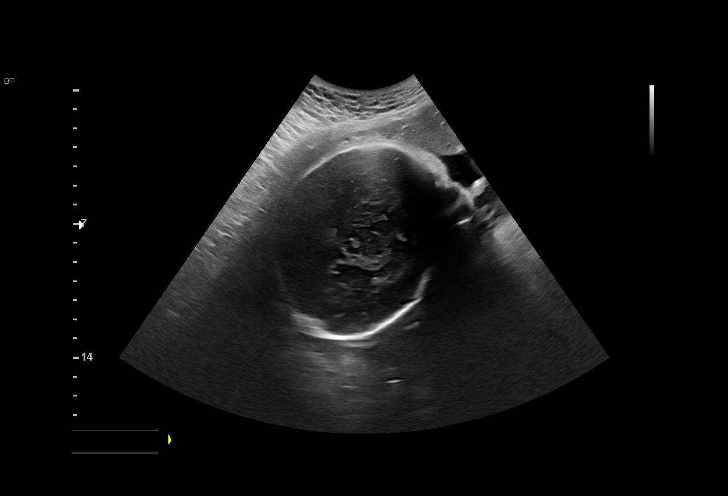
[im 5/42]
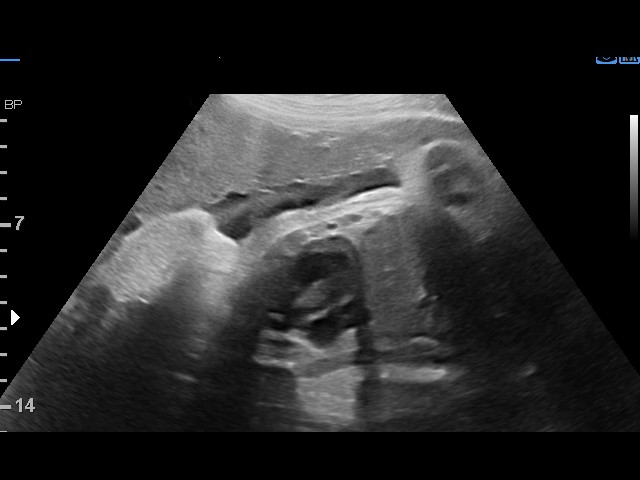
[im 8/42]
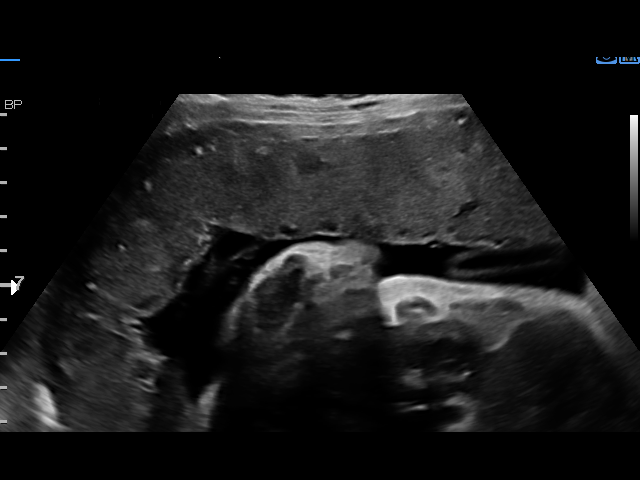
[im 11/42]
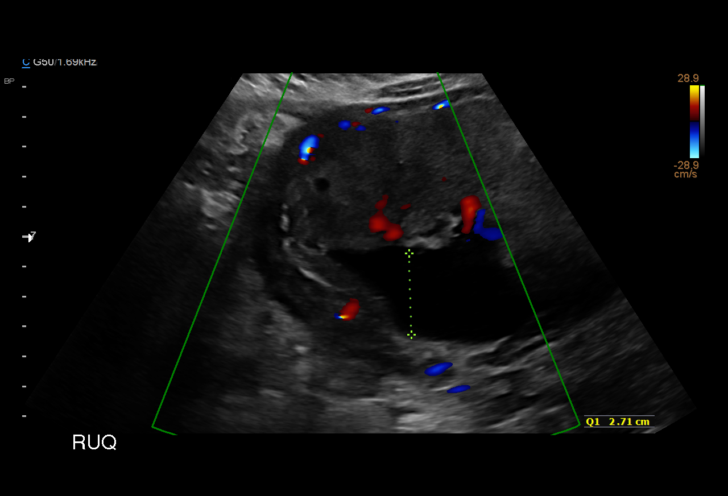
[im 14/42]
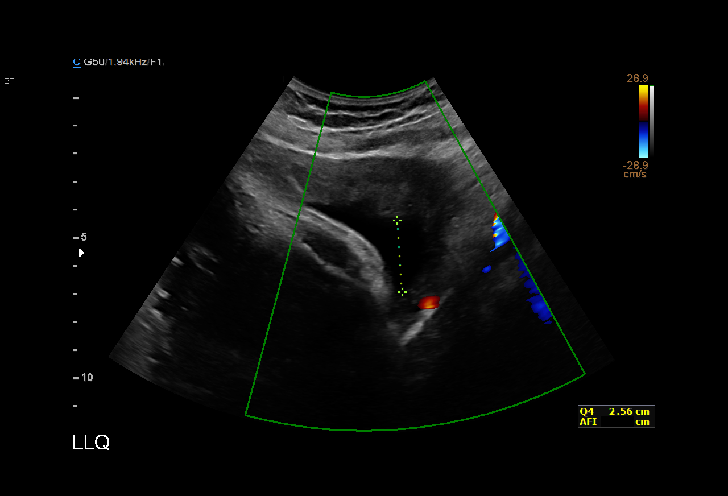
[im 17/42]
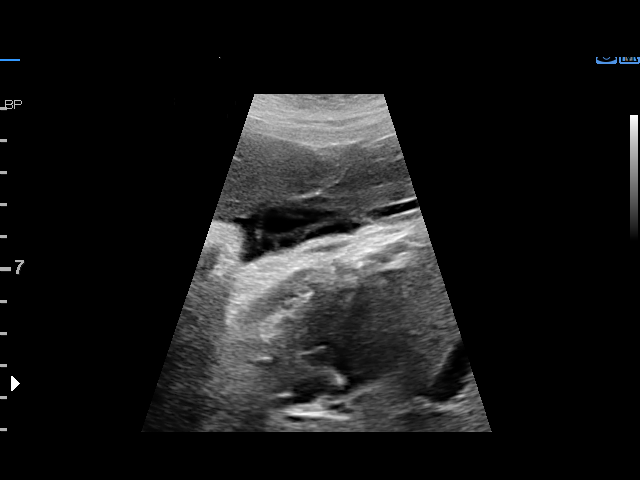
[im 22/42]
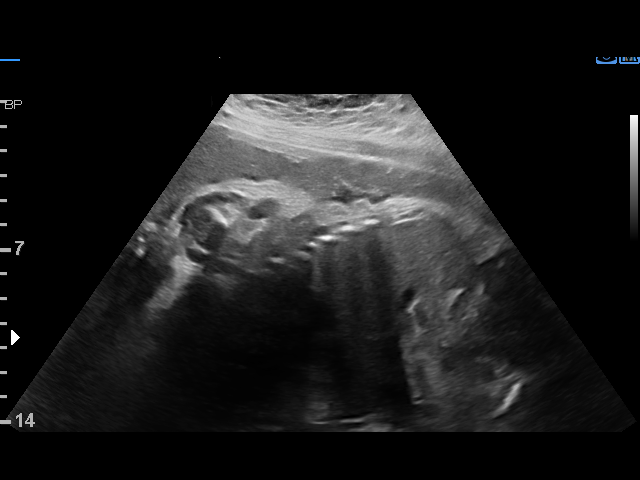
[im 25/42]
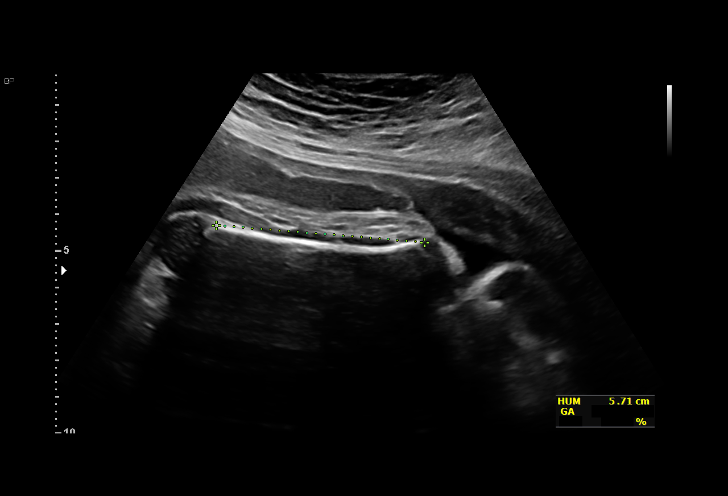
[im 28/42]
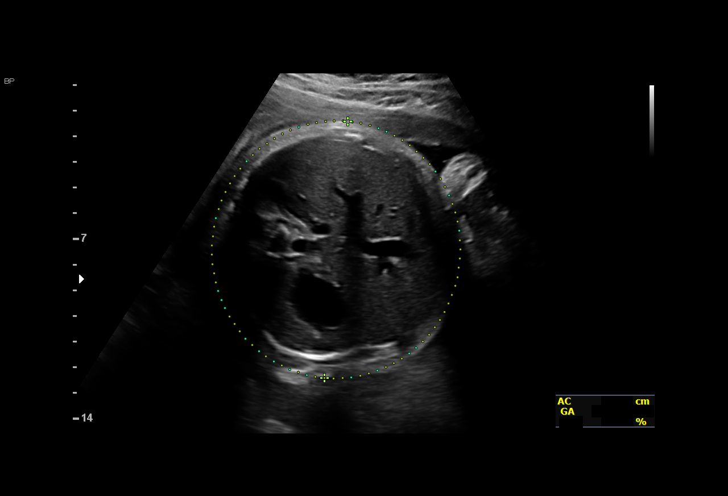
[im 31/42]
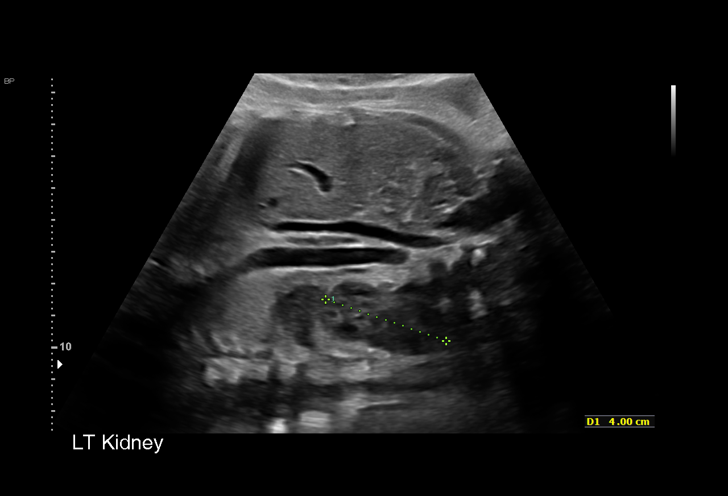
[im 34/42]
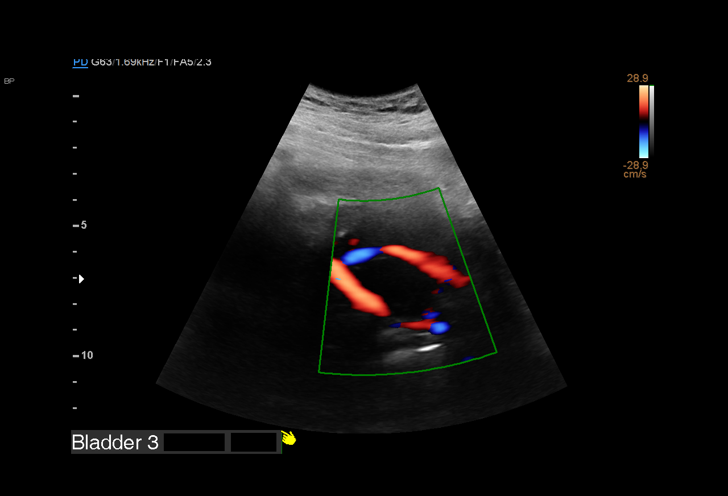
[im 37/42]
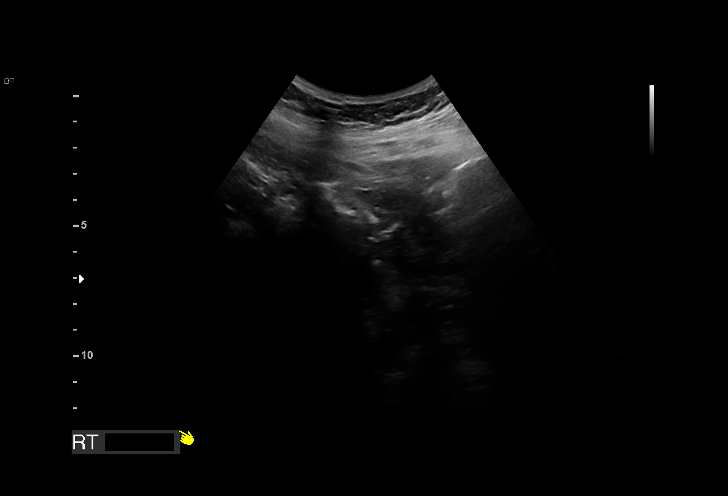
[im 40/42]
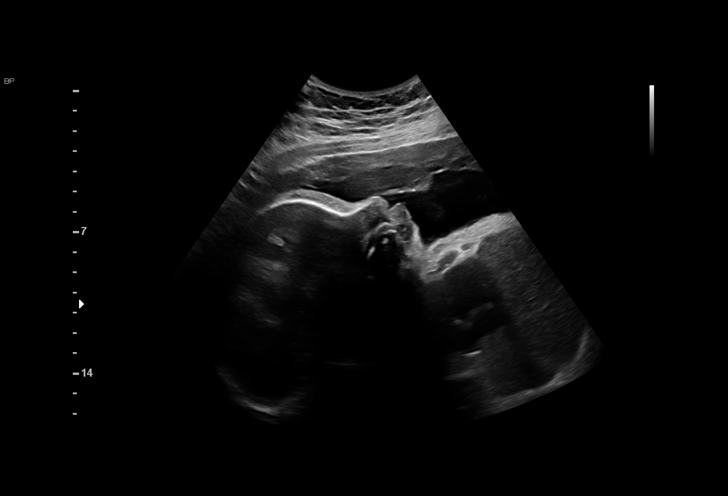

[13 of 28 positions shown; findings below may reference images not displayed]

Indications

 Gestational diabetes in pregnancy, diet
 controlled
 Obesity complicating pregnancy, third
 trimester BMI 32
 Hypertension - Chronic/Pre-existing (no
 meds)
 Poor obstetric history: Previous
 preeclampsia / eclampsia/gestational HTN
 Poor obstetric history: Previous preterm
 delivery, antepartum (36+4 weeks,
 preeclampsia)
 LR NIPS
 34 weeks gestation of pregnancy
Fetal Evaluation

 Num Of Fetuses:         1
 Fetal Heart Rate(bpm):  160
 Cardiac Activity:       Observed
 Presentation:           Breech
 Placenta:               Anterior
 P. Cord Insertion:      Previously Visualized
 Amniotic Fluid
 AFI FV:      Within normal limits

 AFI Sum(cm)     %Tile       Largest Pocket(cm)
 11.55           31

 RUQ(cm)       RLQ(cm)       LUQ(cm)        LLQ(cm)

Biophysical Evaluation

 Amniotic F.V:   Pocket => 2 cm             F. Tone:        Observed
 F. Movement:    Observed                   Score:          [DATE]
 F. Breathing:   Observed
Biometry

 BPD:      86.2  mm     G. Age:  34w 5d         56  %    CI:        72.85   %    70 - 86
                                                         FL/HC:      20.6   %    20.1 -
 HC:      321.1  mm     G. Age:  36w 2d         57  %    HC/AC:      1.05        0.93 -
 AC:      306.7  mm     G. Age:  34w 4d         57  %    FL/BPD:     76.6   %    71 - 87
 FL:         66  mm     G. Age:  34w 0d         26  %    FL/AC:      21.5   %    20 - 24
 HUM:      57.3  mm     G. Age:  33w 2d         36  %

 Est. FW:    9020  gm      5 lb 7 oz     47  %
OB History

 Gravidity:    3         Term:   1        Prem:   1
 Living:       2
Gestational Age

 LMP:           34w 4d        Date:  09/10/20                 EDD:   06/17/21
 U/S Today:     34w 6d                                        EDD:   06/15/21
 Best:          34w 4d     Det. By:  LMP  (09/10/20)          EDD:   06/17/21
Anatomy

 Cranium:               Appears normal         LVOT:                   Previously seen
 Cavum:                 Appears normal         Aortic Arch:            Previously seen
 Ventricles:            Not well visualized    Ductal Arch:            Previously seen
 Choroid Plexus:        Previously seen        Diaphragm:              Appears normal
 Cerebellum:            Previously seen        Stomach:                Appears normal, left
                                                                       sided
 Posterior Fossa:       Previously seen        Abdomen:                Previously seen
 Nuchal Fold:           Previously seen        Abdominal Wall:         Previously seen
 Face:                  Orbits and profile     Cord Vessels:           Appears normal (3
                        previously seen                                vessel cord)
 Lips:                  Previously seen        Kidneys:                Appear normal
 Palate:                Previously seen        Bladder:                Appears normal
 Thoracic:              Appears normal         Spine:                  Previously seen
 Heart:                 Previously seen        Upper Extremities:      Previously seen
 RVOT:                  Previously seen        Lower Extremities:      Previously seen

 Other:  VC, 3VV and 3VTV, Nasal bone and heels previously visualized. Male
         gender previously seen. Technically difficult due to advanced GA and
         fetal position.
Cervix Uterus Adnexa

 Cervix
 Not visualized (advanced GA >79wks)

 Uterus
 No abnormality visualized.

 Right Ovary
 Not visualized.

 Left Ovary
 Not visualized.
Impression

 Chronic hypertension.  Patient does not take
 antihypertensives.  Blood pressures today at her office were
 140/88 and 144/89 mmHg.  Patient gives history of
 intermittent mild headaches.  No  visual disturbances or right
 upper quadrant pain.
 Gestational diabetes.  Reportedly well controlled on diet.

 Fetal growth is appropriate for gestational age.  Amniotic fluid
 is normal and good fetal activity seen.  Antenatal testing is
 reassuring.  BPP [DATE].  Breech presentation
Recommendations

 -Continue weekly BPP till delivery.
 -Consider delivery at 38 or 39 weeks gestation.
                 Asp, Viinu

## 2023-10-04 ENCOUNTER — Ambulatory Visit

## 2023-10-04 DIAGNOSIS — N912 Amenorrhea, unspecified: Secondary | ICD-10-CM

## 2023-10-04 DIAGNOSIS — Z3200 Encounter for pregnancy test, result unknown: Secondary | ICD-10-CM

## 2023-10-04 DIAGNOSIS — Z3201 Encounter for pregnancy test, result positive: Secondary | ICD-10-CM

## 2023-10-04 LAB — POCT PREGNANCY, URINE: Preg Test, Ur: POSITIVE — AB

## 2023-10-04 NOTE — Patient Instructions (Signed)

## 2023-10-04 NOTE — Progress Notes (Signed)
 Patient here in office to drop off urine for pregnancy confirmation. UPT result was positive. I called the patient to report positive pregnancy test result. Patient stated her LMP was 07/20/23. I advised the patient it is recommended to start receiving prenatal care. I reviewed the patients meds and allergies. I sent the patient information about safe meds during pregnancy. The patient denies any bleeding or pain. I reviewed MAU precautions with the patient. The patient stated she wants to receive prenatal care here at the office and she would call the office to get an appointment scheduled. The patient stated she had no further questions or concerns.   Devon, RN 10/04/23

## 2023-10-11 ENCOUNTER — Telehealth (INDEPENDENT_AMBULATORY_CARE_PROVIDER_SITE_OTHER)

## 2023-10-11 DIAGNOSIS — O0991 Supervision of high risk pregnancy, unspecified, first trimester: Secondary | ICD-10-CM

## 2023-10-11 DIAGNOSIS — O0990 Supervision of high risk pregnancy, unspecified, unspecified trimester: Secondary | ICD-10-CM | POA: Insufficient documentation

## 2023-10-11 DIAGNOSIS — Z3A11 11 weeks gestation of pregnancy: Secondary | ICD-10-CM | POA: Diagnosis not present

## 2023-10-11 MED ORDER — GOJJI WEIGHT SCALE MISC: 1.0000 | Freq: Once | 0 refills | Status: AC

## 2023-10-11 MED ORDER — BLOOD PRESSURE KIT DEVI: 1.0000 | Freq: Once | 0 refills | Status: AC

## 2023-10-11 NOTE — Progress Notes (Signed)
 New OB Intake  I connected with Regina [name]  on 10/11/23 at 10:15 AM EDT by Telephone and verified that I am speaking with the correct person using two identifiers. Nurse is located at [facility] and pt is located at Home.  I discussed the limitations, risks, security and privacy concerns of performing an evaluation and management service by telephone and the availability of in person appointments. I also discussed with the patient that there may be a patient responsible charge related to this service. The patient expressed understanding and agreed to proceed.  I explained I am completing New OB Intake today. We discussed EDD of 04/25/24 based on LMP of 07/20/23. Pt is G4P2103. I reviewed her allergies, medications and Medical/Surgical/OB history.    Patient Active Problem List   Diagnosis Date Noted   Supervision of high risk pregnancy, antepartum 10/11/2023   Obesity (BMI 30-39.9) 09/02/2021   History of gestational diabetes 02/19/2021   Primary hypertension 09/19/2019     Concerns addressed today Pt states she has been medication for chronic hypertension in past, she states she doesn't take any meds now due to not going to doctor.  She has not started prenatal vitamins, she declined prescription and plans to buy some over the counter.  While answering Family Genetic screening pt reports that she nor her mother know a diagnosis for her brother's birth defect.  Pt reports her brother is unable to walk independently and both hands are not fully developed.   Delivery Plans Plans to deliver at [facility]. Discussed the nature of our practice with multiple providers including residents and students as well as female and female providers. Due to the size of the practice, the delivering provider may not be the same as those providing prenatal care.   Patient is not interested in water birth.  MyChart/Babyscripts MyChart access verified. I explained pt will have some visits in office and some  virtually. Babyscripts instructions given and order placed.  Blood Pressure Cuff/Weight Scale Blood pressure cuff ordered for patient to pick-up from [employer]. Explained after first prenatal appt pt will check weekly and document in Babyscripts. Patient does not have weight scale; order sent to [facility], patient may track weight weekly in Babyscripts.  Anatomy US  Explained first scheduled US  will be around 19 weeks. Anatomy US  scheduled for 12/04/23 at 09:00am.  Is patient a CenteringPregnancy candidate?  Not a Candidate Not a candidate due to [facility], medication controlled    Is patient a Mom+Baby Combined Care candidate?  Not a candidate   If accepted, confirm patient does not intend to move from the area for at least 12 months, then notify Mom+Baby staff  Is patient a candidate for Babyscripts Optimization? No, due to hypertension.   First visit review I reviewed new OB appt with patient. Explained pt will be seen by Delores, NP at first visit 11/06/23 at 09:55am. Discussed Natera genetic screening with patient. Pt desires Panorama, Horizon negative with past pregnancy . Routine prenatal labs and Panorama needed at [facility] OB visit.    Last Pap Diagnosis  Date Value Ref Range Status  07/08/2021   Final   - Negative for intraepithelial lesion or malignancy (NILM)    Waddell LITTIE Burows, RN 10/11/2023  10:55 AM

## 2023-10-11 NOTE — Patient Instructions (Signed)

## 2023-10-17 ENCOUNTER — Other Ambulatory Visit

## 2023-10-23 ENCOUNTER — Other Ambulatory Visit: Payer: Self-pay

## 2023-10-23 ENCOUNTER — Other Ambulatory Visit: Payer: Self-pay | Admitting: Obstetrics and Gynecology

## 2023-10-23 ENCOUNTER — Ambulatory Visit (INDEPENDENT_AMBULATORY_CARE_PROVIDER_SITE_OTHER)

## 2023-10-23 DIAGNOSIS — O0992 Supervision of high risk pregnancy, unspecified, second trimester: Secondary | ICD-10-CM | POA: Diagnosis not present

## 2023-10-23 DIAGNOSIS — Z3A14 14 weeks gestation of pregnancy: Secondary | ICD-10-CM | POA: Diagnosis not present

## 2023-10-23 DIAGNOSIS — O0990 Supervision of high risk pregnancy, unspecified, unspecified trimester: Secondary | ICD-10-CM

## 2023-10-29 ENCOUNTER — Ambulatory Visit: Payer: Self-pay | Admitting: Obstetrics and Gynecology

## 2023-11-06 ENCOUNTER — Other Ambulatory Visit: Payer: Self-pay

## 2023-11-06 ENCOUNTER — Ambulatory Visit (INDEPENDENT_AMBULATORY_CARE_PROVIDER_SITE_OTHER): Payer: Self-pay | Admitting: Obstetrics and Gynecology

## 2023-11-06 ENCOUNTER — Other Ambulatory Visit (HOSPITAL_COMMUNITY)
Admission: RE | Admit: 2023-11-06 | Discharge: 2023-11-06 | Disposition: A | Discharge status: Home / Self Care | Source: Ambulatory Visit | Attending: Obstetrics and Gynecology | Admitting: Obstetrics and Gynecology

## 2023-11-06 VITALS — BP 138/84 | HR 86 | Wt 143.8 lb

## 2023-11-06 DIAGNOSIS — O10919 Unspecified pre-existing hypertension complicating pregnancy, unspecified trimester: Secondary | ICD-10-CM

## 2023-11-06 DIAGNOSIS — Z98891 History of uterine scar from previous surgery: Secondary | ICD-10-CM

## 2023-11-06 DIAGNOSIS — Z8759 Personal history of other complications of pregnancy, childbirth and the puerperium: Secondary | ICD-10-CM | POA: Diagnosis not present

## 2023-11-06 DIAGNOSIS — Z1332 Encounter for screening for maternal depression: Secondary | ICD-10-CM | POA: Diagnosis not present

## 2023-11-06 DIAGNOSIS — O0990 Supervision of high risk pregnancy, unspecified, unspecified trimester: Secondary | ICD-10-CM | POA: Diagnosis present

## 2023-11-06 DIAGNOSIS — Z8632 Personal history of gestational diabetes: Secondary | ICD-10-CM

## 2023-11-06 DIAGNOSIS — O10912 Unspecified pre-existing hypertension complicating pregnancy, second trimester: Secondary | ICD-10-CM

## 2023-11-06 DIAGNOSIS — Z8751 Personal history of pre-term labor: Secondary | ICD-10-CM

## 2023-11-06 DIAGNOSIS — Z31430 Encounter of female for testing for genetic disease carrier status for procreative management: Secondary | ICD-10-CM

## 2023-11-06 DIAGNOSIS — Z3A16 16 weeks gestation of pregnancy: Secondary | ICD-10-CM | POA: Diagnosis not present

## 2023-11-06 DIAGNOSIS — Z3482 Encounter for supervision of other normal pregnancy, second trimester: Secondary | ICD-10-CM

## 2023-11-06 DIAGNOSIS — O0992 Supervision of high risk pregnancy, unspecified, second trimester: Secondary | ICD-10-CM

## 2023-11-06 MED ORDER — ASPIRIN 81 MG PO TBEC: 162.0000 mg | DELAYED_RELEASE_TABLET | Freq: Every day | ORAL | 2 refills | Status: AC

## 2023-11-06 NOTE — Progress Notes (Signed)
 INITIAL PRENATAL VISIT  Subjective:   Regina [name] is being seen today for her first obstetrical visit.  She is at [redacted]w[redacted]d gestation by ultrasound.  Her obstetrical history is significant for history of cesarean section 2023 d/t breech, hx of PTD and severe preeclampsia  2019. History of gestational diabetes 2023. Relationship with FOB: not involved. Patient does intend to breast feed. Pregnancy history fully reviewed.  Indications for ASA therapy (per uptodate) One of the following: Previous pregnancy with preeclampsia, especially early onset and with an adverse outcome Yes    Objective:    Obstetric History OB History  Gravida Para Term Preterm AB Living  4 3 2 1  0 3  SAB IAB Ectopic Multiple Live Births  0 0 0 0 3    # Outcome Date GA Lbr Len/2nd Weight Sex Type Anes PTL Lv  4 Current           3 Term 05/28/21 [redacted]w[redacted]d  6 lb 14.8 oz (3.14 kg) M CS-LTranv Spinal  LIV  2 Term 09/20/19 [redacted]w[redacted]d  5 lb 13 oz (2.635 kg) F Vag-Spont   LIV  1 Preterm 12/27/17 [redacted]w[redacted]d 09:00 / 00:25 5 lb 7.5 oz (2.481 kg) F Vag-Spont EPI  LIV    Past Medical History:  Diagnosis Date   Chronic hypertension 09/19/2019   GDM (gestational diabetes mellitus) 07/19/2019   History of preterm delivery 08/02/2019   G1 at [redacted]w[redacted]d due to preE   History of severe pre-eclampsia 04/02/2019   Hx of [facility] spotted fever 07/2015   Migraine without aura and without status migrainosus, not intractable 11/02/2021    Past Surgical History:  Procedure Laterality Date   CESAREAN SECTION N/A 05/28/2021   Procedure: CESAREAN SECTION;  Surgeon: Fredirick Glenys RAMAN, MD;  Location: [facility] LD ORS;  Service: Obstetrics;  Laterality: N/A;    Current Outpatient Medications on File Prior to Visit  Medication Sig Dispense Refill   loratadine  (CLARITIN ) 10 MG tablet Take 1 tablet (10 mg total) by mouth daily. (Patient not taking: Reported on 11/06/2023) 90 tablet 3   No current facility-administered medications on file prior to visit.     No Known Allergies  Social History:  reports that she has never smoked. She has never used smokeless tobacco. She reports that she does not drink alcohol and does not use drugs.  Family History  Problem Relation Age of Onset   Hypertension Mother    Diabetes Maternal Grandmother     The following portions of the patient's history were reviewed and updated as appropriate: allergies, current medications, past family history, past medical history, past social history, past surgical history and problem list.  Review of Systems Review of Systems  All other systems reviewed and are negative.    Physical Exam:  BP 138/84   Pulse 86   Wt 143 lb 12.8 oz (65.2 kg)   LMP 07/20/2023 (Exact Date)   BMI 26.30 kg/m  CONSTITUTIONAL: Well-developed, well-nourished female in no acute distress.  HENT:  Normocephalic, atraumatic.   NECK: Normal range of motion SKIN: Skin is warm and dry. MUSCULOSKELETAL: Normal range of motion NEUROLOGIC: Alert and oriented  PSYCHIATRIC: Normal mood and affect. Normal behavior.  CARDIOVASCULAR: Normal heart rate noted RESPIRATORY: normal effort ABDOMEN: Soft PELVIC:deferred  Fetal Heart Rate (bpm): 153   Movement: Absent       Assessment:    Pregnancy: G4P2103  1. Supervision of high risk pregnancy, antepartum (Primary) BP and FHR normal  - Hemoglobin A1c - Culture,  OB Urine - CBC/D/Plt+RPR+Rh+ABO+RubIgG... - GC/Chlamydia probe amp ([place])not at [facility] - AFP, Serum, Open Spina Bifida  2. History of cesarean section 2023 d/t breech  Desires TOLAC, to discuss at later visit   3. History of preterm delivery First pregnancy 2019 d/t severe pre- e  4. History of severe pre-eclampsia First pregnancy  5. Chronic hypertension affecting pregnancy Normotensive, not on meds Discussed recommendation for ASA 162mg , rx sent  Baseline labs today - TSH - Protein / creatinine ratio, urine - Comprehensive metabolic panel with GFR   6. History  of gestational diabetes Last pregnancy GDM, A1c today  7. [redacted] weeks gestation of pregnancy  8. Encounter for supervision of other normal pregnancy in second trimester  - PANORAMA PRENATAL TEST  9. Encounter of female for testing for genetic disease carrier status for procreative management  - HORIZON Basic Panel      Plan:     Initial labs drawn. Prenatal vitamins. Problem list reviewed and updated. Reviewed in detail the nature of the practice with collaborative care between  Genetic screening discussed: NIPS/First trimester screen/Quad/AFP ordered. Role of ultrasound in pregnancy discussed; Anatomy US : ordered. Follow up in 4 weeks. Weight gain recommendations per IOM guidelines reviewed: underweight/BMI 18.5 or less > 28 - 40 lbs; normal weight/BMI 18.5 - 24.9 > 25 - 35 lbs; overweight/BMI 25 - 29.9 > 15 - 25 lbs; obese/BMI  30 or more > 11 - 20 lbs.  Discussed clinic routines, schedule of care and testing, genetic screening options, involvement of students and residents under the direct supervision of APPs and doctors and presence of female providers. Pt verbalized understanding.   Future Appointments  Date Time Provider Department Center  12/04/2023  9:00 AM [facility] PROVIDER 1 [facility]  12/04/2023  9:30 AM [facility] US2 [facility]  12/05/2023 11:15 AM Nicholaus Burnard HERO, MD [facility]  01/01/2024 10:15 AM Zina Jerilynn LABOR, MD [facility]     Delores Nidia CROME, FNP

## 2023-11-07 ENCOUNTER — Ambulatory Visit: Payer: Self-pay | Admitting: Obstetrics and Gynecology

## 2023-11-07 DIAGNOSIS — O0990 Supervision of high risk pregnancy, unspecified, unspecified trimester: Secondary | ICD-10-CM

## 2023-11-07 LAB — GC/CHLAMYDIA PROBE AMP (~~LOC~~) NOT AT ARMC
Chlamydia: NEGATIVE
Comment: NEGATIVE
Comment: NORMAL
Neisseria Gonorrhea: NEGATIVE

## 2023-11-08 LAB — COMPREHENSIVE METABOLIC PANEL WITH GFR
ALT: 7 IU/L (ref 0–32)
AST: 11 IU/L (ref 0–40)
Albumin: 3.9 g/dL — ABNORMAL LOW (ref 4.0–5.0)
Alkaline Phosphatase: 39 IU/L — ABNORMAL LOW (ref 41–116)
BUN/Creatinine Ratio: 13 (ref 9–23)
BUN: 8 mg/dL (ref 6–20)
Bilirubin Total: 0.2 mg/dL (ref 0.0–1.2)
CO2: 20 mmol/L (ref 20–29)
Calcium: 8.9 mg/dL (ref 8.7–10.2)
Chloride: 106 mmol/L (ref 96–106)
Creatinine, Ser: 0.63 mg/dL (ref 0.57–1.00)
Globulin, Total: 2.4 g/dL (ref 1.5–4.5)
Glucose: 81 mg/dL (ref 70–99)
Potassium: 3.9 mmol/L (ref 3.5–5.2)
Sodium: 139 mmol/L (ref 134–144)
Total Protein: 6.3 g/dL (ref 6.0–8.5)
eGFR: 128 mL/min/1.73 (ref >59)

## 2023-11-08 LAB — CBC/D/PLT+RPR+RH+ABO+RUBIGG...
Antibody Screen: NEGATIVE
Basophils Absolute: 0 x10E3/uL (ref 0.0–0.2)
Basos: 0 %
EOS (ABSOLUTE): 0.1 x10E3/uL (ref 0.0–0.4)
Eos: 1 %
HCV Ab: NONREACTIVE
HIV Screen 4th Generation wRfx: NONREACTIVE
Hematocrit: 36.8 % (ref 34.0–46.6)
Hemoglobin: 12.2 g/dL (ref 11.1–15.9)
Hepatitis B Surface Ag: NEGATIVE
Immature Grans (Abs): 0 x10E3/uL (ref 0.0–0.1)
Immature Granulocytes: 0 %
Lymphocytes Absolute: 0.9 x10E3/uL (ref 0.7–3.1)
Lymphs: 13 %
MCH: 30.8 pg (ref 26.6–33.0)
MCHC: 33.2 g/dL (ref 31.5–35.7)
MCV: 93 fL (ref 79–97)
Monocytes Absolute: 0.4 x10E3/uL (ref 0.1–0.9)
Monocytes: 6 %
Neutrophils Absolute: 5.9 x10E3/uL (ref 1.4–7.0)
Neutrophils: 80 %
Platelets: 219 x10E3/uL (ref 150–450)
RBC: 3.96 x10E6/uL (ref 3.77–5.28)
RDW: 14.8 % (ref 11.7–15.4)
RPR Ser Ql: NONREACTIVE
Rh Factor: POSITIVE
Rubella Antibodies, IGG: 1.45 {index} (ref >0.99)
WBC: 7.4 x10E3/uL (ref 3.4–10.8)

## 2023-11-08 LAB — AFP, SERUM, OPEN SPINA BIFIDA
AFP MoM: 0.66
AFP Value: 24.6 ng/mL
Gest. Age on Collection Date: 16.7 wk
Maternal Age At EDD: 24.2 a
OSBR Risk 1 IN: 10000
Test Results:: NEGATIVE
Weight: 143 [lb_av]

## 2023-11-08 LAB — TSH: TSH: 0.823 u[IU]/mL (ref 0.450–4.500)

## 2023-11-08 LAB — CULTURE, OB URINE

## 2023-11-08 LAB — PROTEIN / CREATININE RATIO, URINE
Creatinine, Urine: 19.1 mg/dL
Protein, Ur: <4 mg/dL

## 2023-11-08 LAB — HEMOGLOBIN A1C
Est. average glucose Bld gHb Est-mCnc: 97 mg/dL
Hgb A1c MFr Bld: 5 % (ref 4.8–5.6)

## 2023-11-08 LAB — URINE CULTURE, OB REFLEX

## 2023-11-08 LAB — HCV INTERPRETATION

## 2023-11-11 LAB — PANORAMA PRENATAL TEST FULL PANEL:PANORAMA TEST PLUS 5 ADDITIONAL MICRODELETIONS: FETAL FRACTION: 10.7

## 2023-11-13 LAB — HORIZON CUSTOM: REPORT SUMMARY: NEGATIVE

## 2023-12-04 ENCOUNTER — Ambulatory Visit: Attending: Maternal & Fetal Medicine | Admitting: Maternal & Fetal Medicine

## 2023-12-04 ENCOUNTER — Ambulatory Visit

## 2023-12-04 ENCOUNTER — Other Ambulatory Visit

## 2023-12-04 ENCOUNTER — Other Ambulatory Visit: Payer: Self-pay | Admitting: Obstetrics and Gynecology

## 2023-12-04 VITALS — BP 130/83 | HR 83

## 2023-12-04 DIAGNOSIS — O09292 Supervision of pregnancy with other poor reproductive or obstetric history, second trimester: Secondary | ICD-10-CM | POA: Insufficient documentation

## 2023-12-04 DIAGNOSIS — O283 Abnormal ultrasonic finding on antenatal screening of mother: Secondary | ICD-10-CM

## 2023-12-04 DIAGNOSIS — Z8751 Personal history of pre-term labor: Secondary | ICD-10-CM

## 2023-12-04 DIAGNOSIS — O10012 Pre-existing essential hypertension complicating pregnancy, second trimester: Secondary | ICD-10-CM | POA: Insufficient documentation

## 2023-12-04 DIAGNOSIS — I10 Essential (primary) hypertension: Secondary | ICD-10-CM

## 2023-12-04 DIAGNOSIS — O09212 Supervision of pregnancy with history of pre-term labor, second trimester: Secondary | ICD-10-CM | POA: Diagnosis not present

## 2023-12-04 DIAGNOSIS — Z98891 History of uterine scar from previous surgery: Secondary | ICD-10-CM | POA: Diagnosis not present

## 2023-12-04 DIAGNOSIS — Z8632 Personal history of gestational diabetes: Secondary | ICD-10-CM

## 2023-12-04 DIAGNOSIS — O358XX0 Maternal care for other (suspected) fetal abnormality and damage, not applicable or unspecified: Secondary | ICD-10-CM | POA: Insufficient documentation

## 2023-12-04 DIAGNOSIS — Z3A20 20 weeks gestation of pregnancy: Secondary | ICD-10-CM | POA: Diagnosis not present

## 2023-12-04 DIAGNOSIS — Z3687 Encounter for antenatal screening for uncertain dates: Secondary | ICD-10-CM | POA: Insufficient documentation

## 2023-12-04 DIAGNOSIS — O0990 Supervision of high risk pregnancy, unspecified, unspecified trimester: Secondary | ICD-10-CM | POA: Diagnosis present

## 2023-12-04 DIAGNOSIS — O3501X0 Maternal care for (suspected) central nervous system malformation or damage in fetus, agenesis of the corpus callosum, not applicable or unspecified: Secondary | ICD-10-CM

## 2023-12-04 DIAGNOSIS — O34211 Maternal care for low transverse scar from previous cesarean delivery: Secondary | ICD-10-CM | POA: Diagnosis not present

## 2023-12-04 DIAGNOSIS — O359XX0 Maternal care for (suspected) fetal abnormality and damage, unspecified, not applicable or unspecified: Secondary | ICD-10-CM

## 2023-12-04 DIAGNOSIS — Z8759 Personal history of other complications of pregnancy, childbirth and the puerperium: Secondary | ICD-10-CM

## 2023-12-04 NOTE — Progress Notes (Signed)
 Patient information  Patient Name: Regina [name]  Patient MRN:   [id number]  Referring practice: MFM Referring Provider: [facility])  Problem List   Patient Active Problem List   Diagnosis Date Noted   History of severe pre-eclampsia 11/06/2023   History of preterm delivery 11/06/2023   History of cesarean section- d/t breech presentation 11/06/2023   Supervision of high risk pregnancy, antepartum 10/11/2023   History of gestational diabetes 02/19/2021   Primary hypertension 09/19/2019    Maternal Fetal Medicine Consult Regina [name] is a 23 y.o. [phone] at [redacted]w[redacted]d here for ultrasound and consultation. She had low risk aneuploidy screening of a female fetus. Carrier screening was Negative for the basic screening (SMA, alpha-thal, beta-thal, and cystic fibroisis. Maternal serum AFP was negative. She has no acute concerns.   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  I discussed with the patient DWM refers to a spectrum of abnormalities involving enlargement of the posterior fossa, elevation of the cerebellar tentorium and torcular, dilation of the fourth ventricle, and a small, malformed, or absent cerebellar vermis. Ventriculomegaly or hydrocephaly is present in approximately 70-80% of cases, and hydrocephaly often progresses after birth.  DWM is rare, with an estimated incidence of 1 in 30,000 births, and accounts for 4-12% of infantile hydrocephaly. It results from  developmental arrest of the hindbrain between 4 and 6 weeks of gestation. Key diagnostic criteria include a large cisterna magna communicating with the fourth ventricle, absent or hypoplastic vermis, cerebellar hemispheres splayed apart with dysplasia, elevated tentorium, and varying degrees of ventriculomegaly. The differential diagnosis includes Blake pouch cyst, posterior fossa arachnoid cyst, mega cisterna magna, and isolated vermian hypoplasia.  Associated anomalies are common, occurring in approximately 60% of prenatally diagnosed cases and 50-70% of postnatal cases. CNS anomalies include hydrocephaly, ACC, holoprosencephaly, cephaloceles, neuronal migration disorders, and heterotopias. Non-CNS anomalies may involve the heart, kidneys, limbs, diaphragm, abdominal wall, craniofacial structures, and genitalia, and fetal growth restriction can also occur. Chromosomal abnormalities--including trisomies 85, 46, 53, trisomy 31, triploidy, and deletions such as 6p and 3q22-q24--are reported. Genetic syndromes such as Walker-Warburg, Joubert, Meckel-Gruber, and tubulinopathies are additional considerations. I discussed the the role of amniocentesis as well as the risk, benefits and alternatives to this procedure.  She elected to undergo amniocentesis.  Prognosis is variable. In isolated DWM, up to one-third of children may develop normally, particularly if the vermis is lobulated and no other anomalies are present. However, motor deficits--including hypotonia, ataxia, and delayed developmental milestones--are common, and approximately half of affected children have intellectual disability. Approximately 80% of infants develop hydrocephaly by 20 months of age, with shunt placement being the standard treatment. Intrauterine fetal demise occurs in roughly 14% of cases. Overall infant mortality ranges from 10-66%, with significantly higher mortality when multiple organ systems are affected. Major causes of death include  infection, uncontrolled hydrocephaly, and shunt complications.  I discussed the strong possibility of severe developmental delay including but not limited to a newborn requiring a feeding tube, wheelchair-bound, severe speech and intellectual disabilities as well as the need for  multiple procedures and significant medical attention.  I discussed that this would be considered a life limiting anomaly if it were continue to persist on prenatal ultrasound.  We discussed the [place]  State termination statute as well as the timing of abortion in the setting of a life limiting anomaly.  I discussed that in the case of a life limiting anomaly termination can occur through the 20/4-week of pregnancy in the state of [place] .  The patient verbalized that she does not desire termination and is aware of the potential medical complications associated with the fetuses current CNS abnormality.    Poor fetal growth The estimated fetal weight is at the 8 percentile overall.  This could represent early growth restriction but since the gestational age is less than 24 weeks we typically do not diagnose at this early.  We will follow her with serial umbilical artery Dopplers later in the pregnancy at 24 weeks and beyond if she is still growth restricted.  Other complications: The patient also has a history of chronic hypertension, just history of gestational diabetes, preterm birth preeclampsia and a cesarean delivery due to breech presentation.  Due to time constraints we did not discuss any of these problems but if a MFM consult is desired please refer back to MFM.  I have her scheduled to come back in 4 weeks at that time we will continue to address all of her problems if possible.  Counseling was provided regarding further evaluation, available diagnostic testing, neonatal outcomes, long-term prognosis, and pregnancy options, including continuation with serial monitoring or termination depending on gestational  age and legal considerations. Delivery planning at a tertiary center with NICU, pediatric neurology, and neurosurgical support is recommended.  Amniocentesis procedure I discussed the risks, benefits and alternatives to an amniocentesis including potential major complications including fetal loss at the rate of 1 out of 500.  I also discussed the risk of infection, bleeding and rupture membranes resulting in a preterm birth.  The benefit is to obtain a genetic diagnosis prior to delivery.  An alternative would be no further genetic assessment. The patient requests to have an amniocentesis procedure today for definitive diagnosis of fetal aneuploidy.   She denies any problems since her last exam. After informed consent was obtained, a timeout was performed verifying the patient's identity and the indications for the procedure.  The patient was then prepped and draped in the usual sterile fashion. An uncomplicated genetic amniocentesis was performed today obtaining 30 cc of straw-colored amniotic fluid which was then sent off for amniotic fluid AFP, CMA, and chromosome analysis.  The patient was advised that our genetic counselor will notify her regarding the results of the amniocentesis.  Post amniocentesis instructions were discussed.  As the patient's blood type is Rh positive, RhoGam was not indicated following the procedure.    Antenatal care -A detailed anatomy was completed today, see report for findings.  A neuro sonogram was also performed but was limited due to fetal position. -Fetal echocardiography was ordered due to the association with congenital heart disease and CNS malformations -Genetic counseling was performed and amniocentesis was also completed -karyotype, microarray and AFP.  If these are negative we can consider whole genome or specific gene testing for ciliopathies, tubulinopathies, and congenital muscular dystrophies. -Fetal MRI will be ordered between 24 and 28 weeks to assess the  posterior fossa and identify any other additional brain abnormalities.   -After delivery NICU and pediatric neurology/neurosurgery consultation is recommended.  The patient will be  added to our [facility] (interdisciplinary meeting) to discuss her prenatal care in addition to her delivery location -Termination of pregnancy was discussed and was declined -Serial ultrasounds to monitor fetal biometry and the degree of ventriculomegaly and head circumference. - 81 mg of aspirin  plus baseline preeclampsia labs due to her history of preeclampsia - Consider early diabetic screening due to her history of GDM Obstetric Management -Vaginal delivery is not contraindicated in DWM. -Cesarean delivery should be reserved for standard obstetric indications or for macrocephaly preventing safe vaginal delivery. -In cases of severe hydrocephaly, consider cephalocentesis to decompress the head and enable vaginal delivery, following institutional protocols and ethical guidance. Neonatal Management -Neonates may be asymptomatic at birth or may develop symptoms within the first year. -Macrocephaly may be present at birth in cases of severe ventriculomegaly. -Hydrocephaly is progressive, developing in approximately 80% of cases; ventriculoperitoneal shunt placement is the standard of care. -Obtain postnatal head ultrasound and MRI to confirm in-utero findings. -Offer postnatal genetic testing if necessary based on prenatal genetic findings. -Arrange pediatric neurology consultation and referral to early-intervention developmental services. Key counseling points -Motor deficits may include hypotonia, ataxia, and delayed developmental milestones. -Approximately 50% have intellectual disability. -Intrauterine fetal demise occurs in ~14% of cases. -80% develop hydrocephaly by 3 months and often require shunt placement. -Infant mortality ranges from 10-66%, with highest mortality in cases involving multiple  malformations. -Children with no associated CNS or extracranial anomalies and a normal lobulated vermis have the most favorable prognosis, with up to one-third demonstrating normal development.   Review of Systems: A review of systems was performed and was negative except per HPI   Past Obstetrical History:  OB History  Gravida Para Term Preterm AB Living  4 3 2 1  0 3  SAB IAB Ectopic Multiple Live Births  0 0 0 0 3    # Outcome Date GA Lbr Len/2nd Weight Sex Type Anes PTL Lv  4 Current           3 Term 05/28/21 [redacted]w[redacted]d  6 lb 14.8 oz (3.14 kg) M CS-LTranv Spinal  LIV  2 Term 09/20/19 [redacted]w[redacted]d  5 lb 13 oz (2.635 kg) F Vag-Spont   LIV  1 Preterm 12/27/17 [redacted]w[redacted]d 09:00 / 00:25 5 lb 7.5 oz (2.481 kg) F Vag-Spont EPI  LIV     Past Medical History:  Past Medical History:  Diagnosis Date   Chronic hypertension 09/19/2019   GDM (gestational diabetes mellitus) 07/19/2019   History of preterm delivery 08/02/2019   G1 at [redacted]w[redacted]d due to preE   History of severe pre-eclampsia 04/02/2019   Hx of [facility] spotted fever 07/2015   Migraine without aura and without status migrainosus, not intractable 11/02/2021   Obesity (BMI 30-39.9) 09/02/2021     Past Surgical History:    Past Surgical History:  Procedure Laterality Date   CESAREAN SECTION N/A 05/28/2021   Procedure: CESAREAN SECTION;  Surgeon: Fredirick Glenys RAMAN, MD;  Location: [facility] LD ORS;  Service: Obstetrics;  Laterality: N/A;     Home Medications:   Current Outpatient Medications on File Prior to Visit  Medication Sig Dispense Refill   Prenatal Vit-Fe Fumarate-FA (PRENATAL MULTIVITAMIN) TABS tablet Take 1 tablet by mouth daily at 12 noon.     aspirin  EC 81 MG tablet Take 2 tablets (162 mg total) by mouth daily. Start taking when you are [redacted] weeks pregnant for rest of pregnancy for prevention of preeclampsia (Patient not taking: Reported on 12/04/2023) 300 tablet 2   loratadine  (CLARITIN )  10 MG tablet Take 1 tablet (10 mg total) by mouth  daily. (Patient not taking: Reported on 11/06/2023) 90 tablet 3   No current facility-administered medications on file prior to visit.      Allergies:   No Known Allergies   Physical Exam:   Vitals:   12/04/23 0901  BP: 130/83  Pulse: 83   Sitting comfortably on the sonogram table Nonlabored breathing Normal rate and rhythm Abdomen is nontender  Thank you for the opportunity to be involved with this patient's care. Please let us  know if we can be of any further assistance.   70 minutes of time was spent reviewing the patient's chart including labs, imaging and documentation.  At least 50% of this time was spent with direct patient care discussing the diagnosis, management and prognosis of her care.  Delora Smaller MFM, [facility]   12/04/2023  4:15 PM

## 2023-12-05 ENCOUNTER — Encounter: Payer: Self-pay | Admitting: Obstetrics and Gynecology

## 2023-12-05 ENCOUNTER — Other Ambulatory Visit: Payer: Self-pay

## 2023-12-05 ENCOUNTER — Ambulatory Visit: Admitting: Obstetrics and Gynecology

## 2023-12-05 VITALS — BP 127/87 | HR 108 | Wt 142.3 lb

## 2023-12-05 DIAGNOSIS — Z8759 Personal history of other complications of pregnancy, childbirth and the puerperium: Secondary | ICD-10-CM

## 2023-12-05 DIAGNOSIS — I10 Essential (primary) hypertension: Secondary | ICD-10-CM

## 2023-12-05 DIAGNOSIS — O4290 Premature rupture of membranes, unspecified as to length of time between rupture and onset of labor, unspecified weeks of gestation: Secondary | ICD-10-CM

## 2023-12-05 DIAGNOSIS — Z8751 Personal history of pre-term labor: Secondary | ICD-10-CM

## 2023-12-05 DIAGNOSIS — O0990 Supervision of high risk pregnancy, unspecified, unspecified trimester: Secondary | ICD-10-CM

## 2023-12-05 DIAGNOSIS — Z98891 History of uterine scar from previous surgery: Secondary | ICD-10-CM

## 2023-12-05 DIAGNOSIS — Z8632 Personal history of gestational diabetes: Secondary | ICD-10-CM

## 2023-12-05 DIAGNOSIS — O283 Abnormal ultrasonic finding on antenatal screening of mother: Secondary | ICD-10-CM | POA: Insufficient documentation

## 2023-12-05 NOTE — Progress Notes (Signed)
 PRENATAL VISIT NOTE  Subjective:  Regina [name] is a 23 y.o. [phone] at [redacted]w[redacted]d being seen today for ongoing prenatal care.  She is currently monitored for the following issues for this high-risk pregnancy and has Primary hypertension; History of gestational diabetes; Supervision of high risk pregnancy, antepartum; History of severe pre-eclampsia; History of preterm delivery; History of cesarean section- d/t breech presentation; and Abnormal fetal ultrasound on their problem list.  Patient reports leaking fluid since yesterday.  Contractions: Not present. Vag. Bleeding: None.  Movement: Present. Denies leaking of fluid.   The following portions of the patient's history were reviewed and updated as appropriate: allergies, current medications, past family history, past medical history, past social history, past surgical history and problem list.   Objective:   Vitals:   12/05/23 1121 12/05/23 1200  BP: (!) 142/89 127/87  Pulse: 93 (!) 108  Weight: 142 lb 4.8 oz (64.5 kg)     Fetal Status:  Fetal Heart Rate (bpm): 156   Movement: Present    General: Alert, oriented and cooperative. Patient is in no acute distress.  Skin: Skin is warm and dry. No rash noted.   Cardiovascular: Normal heart rate noted  Respiratory: Normal respiratory effort, no problems with respiration noted  Abdomen: Soft, gravid, appropriate for gestational age.  Pain/Pressure: Present (cramping)     Pelvic: Cervical exam deferred        Extremities: Normal range of motion.  Edema: None  Mental Status: Normal mood and affect. Normal behavior. Normal judgment and thought content.   Pelvic pooling: negative, ferning negative     11/06/2023   10:14 AM 11/29/2021   11:18 AM 09/23/2021    9:43 AM  Depression screen PHQ 2/9  Decreased Interest 3 0 0  Down, Depressed, Hopeless 0 0 0  PHQ - 2 Score 3 0 0  Altered sleeping 3  0  Tired, decreased energy 3  0  Change in appetite 1  0  Feeling bad or failure about  yourself  0  0  Trouble concentrating 1  0  Moving slowly or fidgety/restless 1  0  Suicidal thoughts 0  0  PHQ-9 Score 12   0      Data saved with a previous flowsheet row definition        11/06/2023   10:15 AM 09/23/2021    9:43 AM 09/02/2021   11:50 AM 05/25/2021   10:17 AM  GAD 7 : Generalized Anxiety Score  Nervous, Anxious, on Edge 0 0 0 0  Control/stop worrying 0 0 0 0  Worry too much - different things 0 0 0 0  Trouble relaxing 0 0 0 0  Restless 0 0 0 0  Easily annoyed or irritable 0 0 0 0  Afraid - awful might happen 0 0 0 0  Total GAD 7 Score 0 0 0 0  Anxiety Difficulty   Not difficult at all     Assessment and Plan:  Pregnancy: [id number] at [redacted]w[redacted]d\  1. Abnormal fetal ultrasound (Primary) Reviewed findings on ultrasound, diagnosis, inability to definitively diagnose at this point without further information however could have a variety of outcomes including severe developmental and physical issues, recommend further eval via fetal MRI, neuro peds, fetal echo, patient agreeable - she declines termination but does state she is concerned about fetus/infant being miserable or in pain - US  Fetal Echocardiography; Future - MR FETAL INC PELVIS EACH ADDITIONAL GESTATION; Future - Ambulatory referral to Pediatric Neurology  2. History of  gestational diabetes  3. Supervision of high risk pregnancy, antepartum  4. History of severe pre-eclampsia Start baby aspirin  (has had trouble getting at pharmacy)  5. History of preterm delivery  6. History of cesarean section- d/t breech presentation  7. Primary hypertension Elevated BP today, repeat normal Will start meds prn  8. Amniotic fluid leaking Negative pooling/ferning Reviewed to go to [facility]U if continues leaking   Preterm labor symptoms and general obstetric precautions including but not limited to vaginal bleeding, contractions, leaking of fluid and fetal movement were reviewed in detail with the patient. Please refer  to After Visit Summary for other counseling recommendations.   Return in about 1 month (around 01/05/2024) for high OB.  Future Appointments  Date Time Provider Department Center  01/01/2024 10:15 AM Hoyle Garre, NP [facility]  01/08/2024 11:15 AM [facility] PROVIDER 1 [facility]  01/08/2024 11:30 AM [facility] US1 [facility]    Burnard CHRISTELLA Moats, MD

## 2023-12-07 LAB — MCC TRACKING

## 2023-12-10 LAB — MCC TRACKING

## 2023-12-12 LAB — MATERNAL CELL CONTAMINATION

## 2023-12-13 ENCOUNTER — Telehealth: Payer: Self-pay

## 2023-12-13 ENCOUNTER — Ambulatory Visit

## 2023-12-13 DIAGNOSIS — Z3A22 22 weeks gestation of pregnancy: Secondary | ICD-10-CM | POA: Diagnosis not present

## 2023-12-13 DIAGNOSIS — O283 Abnormal ultrasonic finding on antenatal screening of mother: Secondary | ICD-10-CM

## 2023-12-13 NOTE — Progress Notes (Signed)
 [facility] at [facility] [address], [address] Phone:  [phone]   Fax:  [phone]      Virtual Visit via Telephone Note  I connected with  Kamara Allan Peeks on 12/13/23 at 10:30 AM EST by a telephone enabled telemedicine application and verified that I am speaking with the correct person using two identifiers.  Location: Patient: [place]. Provider: [facility] for Maternal Fetal Care.   I discussed the limitations of evaluation and management by telemedicine and the availability of in person appointments. The patient expressed understanding and agreed to proceed.  Genetic Counseling Clinic Note:   I spoke with 23 y.o. Abram Abram Graybill today to discuss preliminary amniocentesis results. She was referred by Delores Nidia CROME, FNP.   Pregnancy History:    [id number]. EGA: [redacted]w[redacted]d by US . EDD: 04/17/2024. Valera has three healthy children. Denies major personal health concerns. Denies bleeding, infections, and fevers in this pregnancy. Denies using tobacco, alcohol, or street drugs in this pregnancy.   Family History:    A three-generation pedigree was created and scanned into Epic under the Media tab.  Patient reports her 47 yo brother was born with an unknown spinal issue as well as missing fingers and toes. He does not have intellectual disability and currently attends college. She does not have additional information but stated this may have been due to maternal exposures to certain chemicals in [place].  Patient ethnicity reported as Thai and FOB ethnicity reported as Thai. Denies Ashkenazi Jewish ancestry.  Family history otherwise not remarkable for consanguinity, individuals with birth defects, intellectual disability, autism spectrum disorder, multiple spontaneous abortions, still births, or unexplained neonatal death.   Abnormal Fetal Ultrasound :  During the anatomy scan performed on 12/04/2023, a fetal brain abnormality was  noted. 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  This was discussed with the patient by MFM Dr. William. She elected amniocentesis, and we reviewed the preliminary results today.  Chromosomal microarray: arr(X,1-22)x2. This is a normal result. No copy number variants were detected in the sample.  Karyotype: 62, XX. This is a normal result. No aneuploidies were detected in the sample.  AF-AFP: within normal limits.  We reviewed that since no chromosomal difference was detected so far, Stephanne has the option of additional testing such as prenatal genome sequencing. Dandy-Walker malformation can be associated with single gene conditions such as Meckel syndrome and Coffin-Siris syndrome. We reviewed the technical aspects, benefits, risks, and limitations of this tests, including the consent form for [employer] prenatal genome. She initially elected to proceed with additional testing as she wishes to obtain as much information as possible; however, she preferred to make a decision after her pediatric neurology consultation.  We reviewed that if a single gene condition is detected, this can help inform us  of additional clinical features, prognosis, and next available steps. We reviewed that this can allow them to connect with different specialists for the care of the newborn, and it can provide additional information to assist in the decision of continuation of the pregnancy or termination. After meeting with pediatric neurology, the patient declined additional testing at this time and prefers to have a genetics consultation for her newborn after postnatal  brain imaging is performed and developmental  state is established.   Previous Testing Completed:  Low risk NIPS: Nicki previously completed [facility] noninvasive prenatal screening (NIPS) in this pregnancy. The result is low risk, consistent with a female fetus. This screening significantly reduces but does not eliminate the chance that the current pregnancy has Down syndrome (trisomy 44), trisomy 48, trisomy 19, and common sex chromosome conditions, and 22q11.2 microdeletion syndrome. Please see report for residual risk information. There are many genetic conditions that cannot be detected by NIPS.   Negative carrier screening: Gorgeous previously completed Horizon carrier screening. She screened to not be a carrier for cystic fibrosis (CF), spinal muscular atrophy (SMA), alpha thalassemia, and beta hemoglobinopathies. Please see report for residual risk information. A negative result on carrier screening reduces but does not eliminate the chance of being a carrier.    Plan of Care:   Patient declined additional genetic testing (duo prenatal genome sequencing). Follow-up [facility] ultrasound on 01/08/2024. Postnatal referrals to pediatric neurology and pediatric genetics. Added to [facility] list.   Informed consent was obtained. All questions were answered.   80 minutes were spent on the date of the encounter in service to the patient including preparation, consultation through telephone, discussion of test reports and available next steps, pedigree construction, genetic risk assessment, documentation, and care coordination.    Thank you for sharing in the care of Ellorie with us .  Please do not hesitate to contact us  at [phone] if you have any questions.   Lauraine Bodily, MS, [facility] Certified Genetic Counselor   Genetic counseling student involved in appointment: No.

## 2023-12-13 NOTE — Telephone Encounter (Signed)
 Attempted to call patient with preliminary amniocentesis results. Left message with callback number.

## 2023-12-15 LAB — AFP, AMNIOTIC FLUID
AFP, Amniotic Fluid (mcg/ml): 7.7 ug/mL
Gestational Age(Wks): 20
MOM, Amniotic Fluid: 1.15

## 2023-12-15 LAB — CHROM. 5 COUNT +  REVEAL SNP MICROARRAY
Cells Analyzed: 5
Cells Counted: 5
Cells Karyotyped: 2
Colonies: 5
GTG Band Resolution Achieved: 450

## 2023-12-19 ENCOUNTER — Ambulatory Visit (INDEPENDENT_AMBULATORY_CARE_PROVIDER_SITE_OTHER): Admitting: Neurology

## 2023-12-19 ENCOUNTER — Encounter (INDEPENDENT_AMBULATORY_CARE_PROVIDER_SITE_OTHER): Payer: Self-pay | Admitting: Neurology

## 2023-12-19 VITALS — BP 120/72 | HR 96 | Ht 62.17 in | Wt 144.2 lb

## 2023-12-19 DIAGNOSIS — O283 Abnormal ultrasonic finding on antenatal screening of mother: Secondary | ICD-10-CM | POA: Diagnosis not present

## 2023-12-19 DIAGNOSIS — Z3A00 Weeks of gestation of pregnancy not specified: Secondary | ICD-10-CM

## 2023-12-19 NOTE — Patient Instructions (Addendum)
 Based on the fetal ultrasound, your baby may have some degree of Dandy-Walker syndrome which could be with different clinical spectrum in terms of developmental delay from mild to severe I would recommend to continue with genetic testing Following birth, baby will have another head ultrasound and also brain MRI for further evaluation and to evaluate the extent of brain malformation At this time no further testing or treatment needed although occasionally fetal MRI may give us  more information but it would have limited value in terms of prognosis. We will follow-up the baby after birth with MRI and then starting services in case of any developmental delay

## 2023-12-19 NOTE — Progress Notes (Signed)
 Patient: Regina [name] MRN: [id number] Sex: female DOB: 11-20-2000  Provider: Norwood Abu, MD Location of Care: [facility] Child Neurology  Note type: New patient  Referral Source: Nicholaus Burnard HERO, MD History from: patient, [facility] chart Chief Complaint: Abnormal Fetal Ultrasound  History of Present Illness: Regina [name] is a 23 y.o. 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 I discussed with mother that this is a developmental abnormality of the brain that would be with different severity in terms of structural abnormality and in terms of developmental abnormality over the next several months and years after birth. Occasionally there follow-up ultrasound and MRI after birth show mild abnormality and occasionally it would be severe abnormality with more ventriculomegaly and other structural abnormality in the other parts of the brain with occasional progression of the ventriculomegaly and hydrocephalus that needs shunt placement. At this time it is very hard to predict the natural progression and prognosis but baby may have a very mild to severe abnormality in terms of developmental milestones.  Occasionally fetal MRI may help us  to evaluate the structure of the posterior fossa and more details but still it would not give us  any prognosis in terms of clinical progress after birth. I discussed with mother that at this time I do not think any further testing or treatment needed but if she decides to keep  the baby then the next part would be ultrasound and MRI after birth and then regular evaluation after birth and starting early intervention including physical therapy and Occupational Therapy as needed.  Some of these patients may have significant cognitive issues and some may have difficulty with walking and balance issues. She already had some genetic testing but I would recommend to follow-up with genetic service that occasionally would give us  some specific genetic variant that may explain the brain abnormality. Mother understood and agreed with the plan and all the questions were answered and she will continue follow-up with her OB/GYN.  Review of Systems: Review of system as per HPI, otherwise negative.  Past Medical History:  Diagnosis Date   Chronic hypertension 09/19/2019   GDM (gestational diabetes mellitus) 07/19/2019   History of preterm delivery 08/02/2019   G1 at [redacted]w[redacted]d due to preE   History of severe pre-eclampsia 04/02/2019   Hx of [facility] spotted fever 07/2015   Migraine without aura and without status migrainosus, not intractable 11/02/2021   Obesity (BMI 30-39.9) 09/02/2021   Hospitalizations: No., Head Injury: No., Nervous System Infections: No., Immunizations up to date: Yes.    Surgical History Past Surgical History:  Procedure Laterality Date   CESAREAN SECTION N/A 05/28/2021   Procedure: CESAREAN SECTION;  Surgeon: Fredirick Glenys RAMAN, MD;  Location: [facility] LD ORS;  Service: Obstetrics;  Laterality: N/A;    Family History family history includes Diabetes in her maternal grandmother; Hypertension in her mother.   Social History Social History   Socioeconomic History   Marital status: Single    Spouse name: Not on file   Number of children: 3   Years of  education: Not on file   Highest education level: GED or equivalent  Occupational History   Occupation: unemployed  Tobacco Use   Smoking status: Never   Smokeless tobacco: Never  Vaping Use   Vaping  status: Never Used  Substance and Sexual Activity   Alcohol use: Never   Drug use: Never   Sexual activity: Yes    Birth control/protection: None  Other Topics Concern   Not on file  Social History Narrative   Originally from [place]   Speaks Karen, Burmese, English   Came from [place] with family in 2009   Social Drivers of Health   Tobacco Use: Low Risk (12/05/2023)   Patient History    Smoking Tobacco Use: Never    Smokeless Tobacco Use: Never    Passive Exposure: Not on file  Financial Resource Strain: Not on file  Food Insecurity: No Food Insecurity (11/06/2023)   Epic    Worried About Programme Researcher, Broadcasting/film/video in the Last Year: Never true    Ran Out of Food in the Last Year: Never true  Transportation Needs: No Transportation Needs (11/06/2023)   Epic    Lack of Transportation (Medical): No    Lack of Transportation (Non-Medical): No  Physical Activity: Not on file  Stress: Not on file  Social Connections: Unknown (01/11/2022)   Received from [facility]   Social Network    Social Network: Not on file  Depression (PHQ2-9): High Risk (11/06/2023)   Depression (PHQ2-9)    PHQ-2 Score: 12  Alcohol Screen: Not on file  Housing: Not on file  Utilities: Not on file  Health Literacy: Not on file     No Known Allergies  Physical Exam LMP 07/20/2023 (Exact Date)  Not performed.  Assessment and Plan 1. Abnormal fetal ultrasound   ' As mentioned and discussed in HPI. I spent 45 minutes with mother to discuss the findings and explain the abnormal structure on pictures and to answer her questions regarding the prognosis and possibility of different neurological deficits.   No orders of the defined types were placed in this encounter.  No orders of the defined types were placed in this encounter.

## 2024-01-01 ENCOUNTER — Ambulatory Visit: Payer: Self-pay

## 2024-01-01 ENCOUNTER — Ambulatory Visit: Admitting: Student

## 2024-01-01 ENCOUNTER — Encounter: Payer: Self-pay | Admitting: Student

## 2024-01-01 ENCOUNTER — Other Ambulatory Visit: Payer: Self-pay

## 2024-01-01 VITALS — BP 120/80 | HR 92 | Wt 143.9 lb

## 2024-01-01 DIAGNOSIS — Z8751 Personal history of pre-term labor: Secondary | ICD-10-CM

## 2024-01-01 DIAGNOSIS — Z3A24 24 weeks gestation of pregnancy: Secondary | ICD-10-CM | POA: Diagnosis not present

## 2024-01-01 DIAGNOSIS — O0990 Supervision of high risk pregnancy, unspecified, unspecified trimester: Secondary | ICD-10-CM

## 2024-01-01 DIAGNOSIS — O10912 Unspecified pre-existing hypertension complicating pregnancy, second trimester: Secondary | ICD-10-CM

## 2024-01-01 DIAGNOSIS — O283 Abnormal ultrasonic finding on antenatal screening of mother: Secondary | ICD-10-CM | POA: Diagnosis not present

## 2024-01-01 DIAGNOSIS — Z8759 Personal history of other complications of pregnancy, childbirth and the puerperium: Secondary | ICD-10-CM | POA: Diagnosis not present

## 2024-01-01 DIAGNOSIS — Z8632 Personal history of gestational diabetes: Secondary | ICD-10-CM

## 2024-01-01 DIAGNOSIS — O0992 Supervision of high risk pregnancy, unspecified, second trimester: Secondary | ICD-10-CM

## 2024-01-01 DIAGNOSIS — Z98891 History of uterine scar from previous surgery: Secondary | ICD-10-CM | POA: Diagnosis not present

## 2024-01-01 DIAGNOSIS — O10919 Unspecified pre-existing hypertension complicating pregnancy, unspecified trimester: Secondary | ICD-10-CM

## 2024-01-01 NOTE — Progress Notes (Signed)
 "  PRENATAL VISIT NOTE  Subjective:  Regina [name] is a 23 y.o. [phone] at [redacted]w[redacted]d being seen today for ongoing prenatal care.  She is currently monitored for the following issues for this high-risk pregnancy and has Primary hypertension; History of gestational diabetes; Supervision of high risk pregnancy, antepartum; History of severe pre-eclampsia; History of preterm delivery; History of cesarean section- d/t breech presentation; and Abnormal fetal ultrasound on their problem list.  Patient reports headache, nausea, and vomiting that started last week. States it occurs throughout the day and is impacting her ability to eat food. Does not desire medication management. Also noted pelvic pain at night time when laying down to sleep. Contractions: Not present. Vag. Bleeding: None.  Movement: Present, but decreased in the past few days. Denies leaking of fluid.   The following portions of the patient's history were reviewed and updated as appropriate: allergies, current medications, past family history, past medical history, past social history, past surgical history and problem list.   Objective:   Vitals:   01/01/24 1007  BP: 120/80  Pulse: 92  Weight: 143 lb 14.4 oz (65.3 kg)    Fetal Status:  Fetal Heart Rate (bpm): 147   Movement: Present    General: Alert, oriented and cooperative. Patient is in no acute distress.  Skin: Skin is warm and dry. No rash noted.   Cardiovascular: Normal heart rate noted  Respiratory: Normal respiratory effort, no problems with respiration noted  Abdomen: Soft, gravid, appropriate for gestational age.  Pain/Pressure: Present (pelvic pain)     Pelvic: Cervical exam deferred        Extremities: Normal range of motion.  Edema: None  Mental Status: Normal mood and affect. Normal behavior. Normal judgment and thought content.      11/06/2023   10:14 AM 11/29/2021   11:18 AM 09/23/2021    9:43 AM  Depression screen PHQ 2/9  Decreased Interest 3 0 0  Down,  Depressed, Hopeless 0 0 0  PHQ - 2 Score 3 0 0  Altered sleeping 3  0  Tired, decreased energy 3  0  Change in appetite 1  0  Feeling bad or failure about yourself  0  0  Trouble concentrating 1  0  Moving slowly or fidgety/restless 1  0  Suicidal thoughts 0  0  PHQ-9 Score 12   0      Data saved with a previous flowsheet row definition        11/06/2023   10:15 AM 09/23/2021    9:43 AM 09/02/2021   11:50 AM 05/25/2021   10:17 AM  GAD 7 : Generalized Anxiety Score  Nervous, Anxious, on Edge 0 0 0 0  Control/stop worrying 0 0 0 0  Worry too much - different things 0 0 0 0  Trouble relaxing 0 0 0 0  Restless 0 0 0 0  Easily annoyed or irritable 0 0 0 0  Afraid - awful might happen 0 0 0 0  Total GAD 7 Score 0 0 0 0  Anxiety Difficulty   Not difficult at all     Assessment and Plan:  Pregnancy: [id number] at [redacted]w[redacted]d 1. Supervision of high risk pregnancy, antepartum (Primary) - Discussed management of HA in pregnancy. Increased hydration and rest.  - Encouraged small snacks and frequent hydration to avoid nausea. Discussed trying ginger to support nausea. Declined pharmacological support at this time.  - Discussed benefits of pillows and support belt for pelvic pain - Encouraged patient  to seek follow-up care if these symptoms worsen - Bedside ultrasound utilized to provide visual of fetal movement . Follow-up US  is scheduled for Monday, 01/05. Reassurance provided  2. Abnormal fetal ultrasound - US  concerning for Dandy-Walker syndrome - MFM following, Patient declined additional genetic testing (duo prenatal genome sequencing).  - Follow-up [facility] ultrasound on 01/08/2024 - Postnatal referrals to pediatric neurology and pediatric genetics  - Has been added to [facility] list  3. [redacted] weeks gestation of pregnancy - 3rd trimester labs at the next visit  4. History of preterm delivery - No s/s of PTL  5. History of gestational diabetes - GTT next visit  6. History of cesarean section-  d/t breech presentation  7. Chronic hypertension affecting pregnancy 8. History of severe pre-eclampsia - bASA therapy  Preterm labor symptoms and general obstetric precautions including but not limited to vaginal bleeding, contractions, leaking of fluid and fetal movement were reviewed in detail with the patient. Please refer to After Visit Summary for other counseling recommendations.   Return in about 4 weeks (around 01/29/2024) for HOB/GTT, IN-PERSON.  Future Appointments  Date Time Provider Department Center  01/08/2024 11:15 AM [facility] PROVIDER 1 [facility]  01/08/2024 11:30 AM [facility] US1 [facility]  01/29/2024  9:00 AM [facility] LAB [facility]  01/29/2024 11:15 AM Cleatus Moccasin, MD [facility]    Nat Dauer, NP  "

## 2024-01-08 ENCOUNTER — Ambulatory Visit: Admitting: Obstetrics and Gynecology

## 2024-01-08 ENCOUNTER — Ambulatory Visit: Attending: Obstetrics and Gynecology

## 2024-01-08 VITALS — BP 144/83 | HR 70

## 2024-01-08 DIAGNOSIS — Z3A25 25 weeks gestation of pregnancy: Secondary | ICD-10-CM | POA: Diagnosis not present

## 2024-01-08 DIAGNOSIS — O09292 Supervision of pregnancy with other poor reproductive or obstetric history, second trimester: Secondary | ICD-10-CM

## 2024-01-08 DIAGNOSIS — O365920 Maternal care for other known or suspected poor fetal growth, second trimester, not applicable or unspecified: Secondary | ICD-10-CM

## 2024-01-08 DIAGNOSIS — O34219 Maternal care for unspecified type scar from previous cesarean delivery: Secondary | ICD-10-CM

## 2024-01-08 DIAGNOSIS — Z8632 Personal history of gestational diabetes: Secondary | ICD-10-CM

## 2024-01-08 DIAGNOSIS — O10919 Unspecified pre-existing hypertension complicating pregnancy, unspecified trimester: Secondary | ICD-10-CM

## 2024-01-08 DIAGNOSIS — I10 Essential (primary) hypertension: Secondary | ICD-10-CM | POA: Diagnosis not present

## 2024-01-08 DIAGNOSIS — O359XX0 Maternal care for (suspected) fetal abnormality and damage, unspecified, not applicable or unspecified: Secondary | ICD-10-CM

## 2024-01-08 DIAGNOSIS — O358XX0 Maternal care for other (suspected) fetal abnormality and damage, not applicable or unspecified: Secondary | ICD-10-CM | POA: Diagnosis not present

## 2024-01-08 DIAGNOSIS — O10012 Pre-existing essential hypertension complicating pregnancy, second trimester: Secondary | ICD-10-CM

## 2024-01-08 DIAGNOSIS — Z8751 Personal history of pre-term labor: Secondary | ICD-10-CM

## 2024-01-08 DIAGNOSIS — O365990 Maternal care for other known or suspected poor fetal growth, unspecified trimester, not applicable or unspecified: Secondary | ICD-10-CM

## 2024-01-08 DIAGNOSIS — Z8759 Personal history of other complications of pregnancy, childbirth and the puerperium: Secondary | ICD-10-CM | POA: Diagnosis not present

## 2024-01-08 NOTE — Progress Notes (Signed)
 Maternal-Fetal Medicine Consultation  Name: Regina [name]  MRN: [id number]  GA: [id number] [redacted]w[redacted]d   Patient is here for ultrasound evaluation.  She was accompanied by her mother.  On previous ultrasound, intracranial abnormality was seen.  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  Bilateral ventriculomegaly was seen.  After counseling, patient had amniocentesis.  Fetal karyotype and microarray are normal. Patient opted not to have genomic studies.  Patient consultation with pediatric neurologist who counseled her that the full extent of anomaly and prognosis could be predicted only after birth.  She has an appointment for fetal echocardiography on 01/16/2024 at [facility]. Patient has chronic hypertension and does not take antihypertensives.  Blood pressures today at our office were 144/83 and 156/97 mmHg.  Her most recent pregnancy was complicated by preeclampsia with severe features and the patient had cesarean delivery (breech presentation). Before that pregnancy, she had 2 term vaginal deliveries.  All her children are in good health. Her pregnancy is dated by 14-week ultrasound. Ultrasound The estimated fetal weight is at the 1st percentile and the abdominal circumference measurement at the 3rd percentile.  Head circumference measurement is at between -3 and -4 SD.  Umbilical artery Doppler study showed increased S/D ratio.  Placenta is anterior and there is no evidence of previa or placenta accreta spectrum.  Following intracranial findings are seen: -Bilateral ventriculomegaly measuring 1.1 cm. - The CSP is not seen. -On sagittal view (very difficult to obtain), the vermis does not seem to be elevated. - The cerebellum appears small and the cisterna magna appears normal.  Inferiorly, there appears to be communication with the fourth ventricle. We could not perform transvaginal ultrasound because of breech presentation. I counseled the patient on the following: Intracranial  anomaly Differential diagnosis include cerebellar hypoplasia, Dandy-Walker malformation and agenesis of corpus callosum.  We will evaluate intracranial structures (including transvaginal ultrasound) on follow-up visits. I recommended fetal brain MRI evaluation and patient agreed to have MRI.  We will set up an appointment at [facility]. I counseled the patient that prognosis will be based on the anomaly present.  I reassured her of normal chromosomes and microarray.  Severe fetal growth restriction and hypertension Possible causes of fetal growth restriction include fetal anomaly and placental insufficiency.  Patient has chronic hypertension but does not take antihypertensives. I discussed the benefit of treating mild hypertension. Till recently, following guidelines from [employer] of Obstetricians Gynecologists, mild chronic hypertension was not treated with antihypertensives. CHAP study concluded that treatment of mild hypertension is associated with significant reductions in preeclampsia with severe features, severe hypertension, medically indicated preterm birth and low birthweights.  We recommend initiating antihypertensive treatment for patients with mild chronic hypertension. I discussed ultrasound protocol for monitoring fetal growth restriction.  Recommendations - Fetal echocardiography on 01/16/2024 at [facility]. - Will request an appointment for fetal brain MRI. - UA Doppler in 2 weeks and then weekly antenatal testing. - Recommend treating mild hypertension with antihypertensives (in-basket message sent to her provider).   Consultation including face-to-face (more than 50%) counseling 35 minutes.

## 2024-01-12 ENCOUNTER — Other Ambulatory Visit: Payer: Self-pay | Admitting: Student

## 2024-01-12 DIAGNOSIS — O0990 Supervision of high risk pregnancy, unspecified, unspecified trimester: Secondary | ICD-10-CM

## 2024-01-12 DIAGNOSIS — O365990 Maternal care for other known or suspected poor fetal growth, unspecified trimester, not applicable or unspecified: Secondary | ICD-10-CM | POA: Insufficient documentation

## 2024-01-12 DIAGNOSIS — I10 Essential (primary) hypertension: Secondary | ICD-10-CM

## 2024-01-12 MED ORDER — NIFEDIPINE ER OSMOTIC RELEASE 30 MG PO TB24: 30.0000 mg | ORAL_TABLET | Freq: Every day | ORAL | 2 refills | Status: AC

## 2024-01-24 ENCOUNTER — Ambulatory Visit: Admitting: Obstetrics and Gynecology

## 2024-01-24 ENCOUNTER — Ambulatory Visit: Attending: Obstetrics and Gynecology

## 2024-01-24 VITALS — BP 136/87 | HR 92

## 2024-01-24 DIAGNOSIS — O10013 Pre-existing essential hypertension complicating pregnancy, third trimester: Secondary | ICD-10-CM

## 2024-01-24 DIAGNOSIS — Z3A28 28 weeks gestation of pregnancy: Secondary | ICD-10-CM

## 2024-01-24 DIAGNOSIS — O359XX0 Maternal care for (suspected) fetal abnormality and damage, unspecified, not applicable or unspecified: Secondary | ICD-10-CM | POA: Diagnosis present

## 2024-01-24 DIAGNOSIS — O365930 Maternal care for other known or suspected poor fetal growth, third trimester, not applicable or unspecified: Secondary | ICD-10-CM | POA: Diagnosis not present

## 2024-01-24 DIAGNOSIS — O10919 Unspecified pre-existing hypertension complicating pregnancy, unspecified trimester: Secondary | ICD-10-CM | POA: Diagnosis present

## 2024-01-24 DIAGNOSIS — O365990 Maternal care for other known or suspected poor fetal growth, unspecified trimester, not applicable or unspecified: Secondary | ICD-10-CM | POA: Diagnosis not present

## 2024-01-25 NOTE — Progress Notes (Signed)
 After review, MFM consult with provider is not indicated for today  Arna Ranks, MD 01/25/2024 8:58 AM  [facility]

## 2024-01-29 ENCOUNTER — Encounter: Payer: Self-pay | Admitting: Obstetrics and Gynecology

## 2024-01-29 ENCOUNTER — Other Ambulatory Visit: Payer: Self-pay

## 2024-02-01 ENCOUNTER — Ambulatory Visit: Attending: Obstetrics and Gynecology

## 2024-02-01 ENCOUNTER — Ambulatory Visit: Admitting: *Deleted

## 2024-02-01 ENCOUNTER — Ambulatory Visit (HOSPITAL_BASED_OUTPATIENT_CLINIC_OR_DEPARTMENT_OTHER): Admitting: Maternal & Fetal Medicine

## 2024-02-01 VITALS — BP 138/88 | HR 88

## 2024-02-01 DIAGNOSIS — O358XX0 Maternal care for other (suspected) fetal abnormality and damage, not applicable or unspecified: Secondary | ICD-10-CM | POA: Diagnosis not present

## 2024-02-01 DIAGNOSIS — O365920 Maternal care for other known or suspected poor fetal growth, second trimester, not applicable or unspecified: Secondary | ICD-10-CM | POA: Diagnosis not present

## 2024-02-01 DIAGNOSIS — Z3A29 29 weeks gestation of pregnancy: Secondary | ICD-10-CM | POA: Insufficient documentation

## 2024-02-01 DIAGNOSIS — O359XX0 Maternal care for (suspected) fetal abnormality and damage, unspecified, not applicable or unspecified: Secondary | ICD-10-CM | POA: Diagnosis present

## 2024-02-01 DIAGNOSIS — O10012 Pre-existing essential hypertension complicating pregnancy, second trimester: Secondary | ICD-10-CM | POA: Diagnosis not present

## 2024-02-01 DIAGNOSIS — I10 Essential (primary) hypertension: Secondary | ICD-10-CM | POA: Diagnosis present

## 2024-02-01 DIAGNOSIS — O365990 Maternal care for other known or suspected poor fetal growth, unspecified trimester, not applicable or unspecified: Secondary | ICD-10-CM

## 2024-02-01 DIAGNOSIS — O10919 Unspecified pre-existing hypertension complicating pregnancy, unspecified trimester: Secondary | ICD-10-CM | POA: Diagnosis present

## 2024-02-01 DIAGNOSIS — Z8759 Personal history of other complications of pregnancy, childbirth and the puerperium: Secondary | ICD-10-CM | POA: Diagnosis present

## 2024-02-01 DIAGNOSIS — O34219 Maternal care for unspecified type scar from previous cesarean delivery: Secondary | ICD-10-CM | POA: Diagnosis not present

## 2024-02-01 NOTE — Progress Notes (Signed)
 After review, MFM consult with provider is not indicated for today  Regina Nathanel Pipe, MD 02/01/2024 1:49 PM  [facility]

## 2024-02-01 NOTE — Procedures (Signed)
 Regina [name] April 05, 2000 [redacted]w[redacted]d  Fetus A Non-Stress Test Interpretation for 02/01/24  Indication: IUGR  Fetal Heart Rate A Mode: External Baseline Rate (A): 155 bpm Variability: Moderate Accelerations: 10 x 10 Decelerations: None Multiple birth?: No  Uterine Activity Mode: Palpation, Toco Contraction Frequency (min): none Resting Tone Palpated: Relaxed  Interpretation (Fetal Testing) Nonstress Test Interpretation: Reactive Overall Impression: Reassuring for gestational age Comments: Dr. Kizzie reviewed tracing

## 2024-02-01 NOTE — Addendum Note (Signed)
 Addended by: KIZZIE JOANE PARAS on: 02/01/2024 03:38 PM   Modules accepted: Level of Service

## 2024-02-02 ENCOUNTER — Ambulatory Visit: Admitting: Family Medicine

## 2024-02-02 ENCOUNTER — Encounter: Payer: Self-pay | Admitting: Family Medicine

## 2024-02-02 ENCOUNTER — Other Ambulatory Visit: Payer: Self-pay

## 2024-02-02 ENCOUNTER — Inpatient Hospital Stay (HOSPITAL_COMMUNITY)
Admission: AD | Admit: 2024-02-02 | Discharge: 2024-02-02 | Disposition: A | Discharge status: Home / Self Care | Attending: Obstetrics and Gynecology | Admitting: Obstetrics and Gynecology

## 2024-02-02 ENCOUNTER — Encounter (HOSPITAL_COMMUNITY): Payer: Self-pay | Admitting: Obstetrics and Gynecology

## 2024-02-02 ENCOUNTER — Other Ambulatory Visit

## 2024-02-02 VITALS — BP 126/83 | HR 76 | Wt 150.4 lb

## 2024-02-02 DIAGNOSIS — Z8759 Personal history of other complications of pregnancy, childbirth and the puerperium: Secondary | ICD-10-CM | POA: Diagnosis not present

## 2024-02-02 DIAGNOSIS — Z23 Encounter for immunization: Secondary | ICD-10-CM | POA: Diagnosis not present

## 2024-02-02 DIAGNOSIS — O0990 Supervision of high risk pregnancy, unspecified, unspecified trimester: Secondary | ICD-10-CM

## 2024-02-02 DIAGNOSIS — R519 Headache, unspecified: Secondary | ICD-10-CM | POA: Diagnosis present

## 2024-02-02 DIAGNOSIS — O0993 Supervision of high risk pregnancy, unspecified, third trimester: Secondary | ICD-10-CM | POA: Diagnosis not present

## 2024-02-02 DIAGNOSIS — O283 Abnormal ultrasonic finding on antenatal screening of mother: Secondary | ICD-10-CM

## 2024-02-02 DIAGNOSIS — O10013 Pre-existing essential hypertension complicating pregnancy, third trimester: Secondary | ICD-10-CM | POA: Insufficient documentation

## 2024-02-02 DIAGNOSIS — Z98891 History of uterine scar from previous surgery: Secondary | ICD-10-CM | POA: Diagnosis not present

## 2024-02-02 DIAGNOSIS — Z79899 Other long term (current) drug therapy: Secondary | ICD-10-CM | POA: Insufficient documentation

## 2024-02-02 DIAGNOSIS — O10913 Unspecified pre-existing hypertension complicating pregnancy, third trimester: Secondary | ICD-10-CM

## 2024-02-02 DIAGNOSIS — Z3A29 29 weeks gestation of pregnancy: Secondary | ICD-10-CM | POA: Diagnosis not present

## 2024-02-02 DIAGNOSIS — O365930 Maternal care for other known or suspected poor fetal growth, third trimester, not applicable or unspecified: Secondary | ICD-10-CM | POA: Diagnosis not present

## 2024-02-02 DIAGNOSIS — O365990 Maternal care for other known or suspected poor fetal growth, unspecified trimester, not applicable or unspecified: Secondary | ICD-10-CM

## 2024-02-02 DIAGNOSIS — O26893 Other specified pregnancy related conditions, third trimester: Secondary | ICD-10-CM | POA: Insufficient documentation

## 2024-02-02 DIAGNOSIS — Z8632 Personal history of gestational diabetes: Secondary | ICD-10-CM

## 2024-02-02 DIAGNOSIS — Z8751 Personal history of pre-term labor: Secondary | ICD-10-CM | POA: Diagnosis not present

## 2024-02-02 LAB — CBC
HCT: 39.2 % (ref 36.0–46.0)
Hemoglobin: 13.4 g/dL (ref 12.0–15.0)
MCH: 32.1 pg (ref 26.0–34.0)
MCHC: 34.2 g/dL (ref 30.0–36.0)
MCV: 93.8 fL (ref 80.0–100.0)
Platelets: 213 10*3/uL (ref 150–400)
RBC: 4.18 MIL/uL (ref 3.87–5.11)
RDW: 12.9 % (ref 11.5–15.5)
WBC: 8.6 10*3/uL (ref 4.0–10.5)
nRBC: 0 % (ref 0.0–0.2)

## 2024-02-02 LAB — COMPREHENSIVE METABOLIC PANEL WITH GFR
ALT: 5 U/L (ref 0–44)
AST: 16 U/L (ref 15–41)
Albumin: 4 g/dL (ref 3.5–5.0)
Alkaline Phosphatase: 48 U/L (ref 38–126)
Anion gap: 13 (ref 5–15)
BUN: <5 mg/dL — ABNORMAL LOW (ref 6–20)
CO2: 22 mmol/L (ref 22–32)
Calcium: 9.1 mg/dL (ref 8.9–10.3)
Chloride: 103 mmol/L (ref 98–111)
Creatinine, Ser: 0.53 mg/dL (ref 0.44–1.00)
GFR, Estimated: >60 mL/min
Glucose, Bld: 62 mg/dL — ABNORMAL LOW (ref 70–99)
Potassium: 3.7 mmol/L (ref 3.5–5.1)
Sodium: 138 mmol/L (ref 135–145)
Total Bilirubin: 0.3 mg/dL (ref 0.0–1.2)
Total Protein: 7.1 g/dL (ref 6.5–8.1)

## 2024-02-02 LAB — PROTEIN / CREATININE RATIO, URINE
Creatinine, Urine: 195 mg/dL
Protein Creatinine Ratio: 0.2 mg/mg — ABNORMAL HIGH
Total Protein, Urine: 30 mg/dL

## 2024-02-02 MED ORDER — ACETAMINOPHEN 500 MG PO TABS
1000.0000 mg | ORAL_TABLET | Freq: Once | ORAL | Status: AC
  Administered 2024-02-02: 1000 mg via ORAL

## 2024-02-02 MED ORDER — CYCLOBENZAPRINE HCL 10 MG PO TABS: 10.0000 mg | ORAL_TABLET | Freq: Three times a day (TID) | ORAL | 0 refills | Status: AC | PRN

## 2024-02-02 NOTE — MAU Note (Signed)
 Regina [name] is a 24 y.o. at [redacted]w[redacted]d here in MAU reporting: was at [facility] appointment and c/o headache and visual changes so was sent for pre-e eval. Denies VB, LOF, ctx.    Pain score: 4/10 HA Vitals:   02/02/24 1218  BP: (!) 141/80  Pulse: 93  Resp: 18  Temp: 99.2 F (37.3 C)  SpO2: 99%     FHT: 145

## 2024-02-02 NOTE — Patient Instructions (Signed)
 Birth Control: The Options Birth control is also called contraception. Birth control prevents pregnancy. There are many types of birth control. Work with your health care provider to find the best option for you. Birth control that uses hormones These types of birth control have hormones in them to prevent pregnancy. Birth control implant This is a small tube that is put into the skin of your arm. The tube can stay in for up to 3 years. Birth control shot These are shots you get every 3 months. Birth control pills This is a pill you take every day. You need to take it at the same time each day. Birth control patch This is a patch that you put on your skin. You change it 1 time each week for 3 weeks. After that, you take the patch off for 1 week. Vaginal ring  This is a soft plastic ring that you put in your vagina. The ring is left in for 3 weeks. Then, you take it out for 1 week. Then, you put a new ring in. Barrier methods  Female condom This is a thin covering that you put on the penis before sex. The condom is thrown away after sex. Female condom This is a soft, loose covering that you put in the vagina before sex. The condom is thrown away after sex. Diaphragm A diaphragm is a soft barrier that is shaped like a bowl. It must be made to fit your body. You put it in the vagina before sex with a chemical that kills sperm called spermicide. A diaphragm should be left in the vagina for 6-8 hours after sex and taken out within 24 hours. You need to replace a diaphragm: Every 1-2 years. After giving birth. After gaining more than 15 lb (6.8 kg). If you have surgery on your pelvis. Cervical cap This is a small, soft cup that fits over the cervix. The cervix is the lowest part of the uterus. It's put in the vagina before sex, along with spermicide. The cap must be made for you. The cap should be left in for 6-8 hours after sex. It is taken out within 48 hours. A cervical cap must be  prescribed and fit to your body by a provider. It should be replaced every 2 years. Sponge This is a small sponge that is put into the vagina before sex. It must be left in for at least 6 hours after sex. It must be taken out within 30 hours and thrown away. Spermicides These are chemicals that kill or stop sperm from getting into the uterus. They may be a pill, cream, jelly, or foam that you put into your vagina. They should be used at least 10-15 minutes before sex. Intrauterine device An intrauterine device (IUD) is a device that's put in the uterus by a provider. There are two types: Hormone IUD. This kind can stay in for 3-5 years. Copper IUD. This kind can stay in for 10 years. Permanent birth control Female tubal ligation This is surgery to block the fallopian tubes. Female sterilization This is a surgery, called a vasectomy, to tie off the tubes that carry sperm in men. This method takes 3 months to work. Other forms of birth control must be used for 3 months. Natural planning methods This means not having sex on the days the female partner could get pregnant. Here are some types of natural planning birth control: Using a calendar: To keep track of the length of each menstrual cycle. To  find out what days pregnancy can happen. To plan to not have sex on days when pregnancy can happen. Watching for signs of ovulation and not having sex during this time. The female partner can check for ovulation by keeping track of their temperature each day. They can also look for changes in the mucus that comes from the cervix. Where to find more information Centers for Disease Control and Prevention: tonerpromos.no. Then: Enter birth control in the search box. This information is not intended to replace advice given to you by your health care provider. Make sure you discuss any questions you have with your health care provider. Document Revised: 10/26/2023 Document Reviewed: 02/15/2022 Elsevier Patient  Education  2025 [employer].

## 2024-02-02 NOTE — Progress Notes (Signed)
" ° °  Subjective:  Regina [name] is a 24 y.o. [phone] at [redacted]w[redacted]d being seen today for ongoing prenatal care.  She is currently monitored for the following issues for this high-risk pregnancy and has Chronic hypertension in obstetric context in third trimester; History of gestational diabetes; Supervision of high risk pregnancy, antepartum; History of severe pre-eclampsia; History of preterm delivery; History of cesarean section- d/t breech presentation; Abnormal fetal ultrasound; and Intrauterine growth restriction affecting antepartum care of mother on their problem list.  Patient reports no complaints.  Contractions: Not present. Vag. Bleeding: None.  Movement: Present. Denies leaking of fluid.   The following portions of the patient's history were reviewed and updated as appropriate: allergies, current medications, past family history, past medical history, past social history, past surgical history and problem list. Problem list updated.  Objective:   Vitals:   02/02/24 0841  BP: 126/83  Pulse: 76  Weight: 150 lb 6.4 oz (68.2 kg)    Fetal Status: Fetal Heart Rate (bpm): 146   Movement: Present     General:  Alert, oriented and cooperative. Patient is in no acute distress.  Skin: Skin is warm and dry. No rash noted.   Cardiovascular: Normal heart rate noted  Respiratory: Normal respiratory effort, no problems with respiration noted  Abdomen: Soft, gravid, appropriate for gestational age. Pain/Pressure: Absent     Pelvic: Vag. Bleeding: None     Cervical exam deferred        Extremities: Normal range of motion.  Edema: None  Mental Status: Normal mood and affect. Normal behavior. Normal judgment and thought content.   Urinalysis:      Assessment and Plan:  Pregnancy: [id number] at [redacted]w[redacted]d  1. Supervision of high risk pregnancy, antepartum (Primary) BP and FHR normal Third trimester labs today Has been having bad headaches for the past two weeks, has not ben taking anything for them.  Reports her head hurts a little bit right now and she has some blurry vision with spots  Given 1g of tylenol  and monitored during her GTT, at completion reported persistent symptoms so sent to MAU for further evaluation Offered TDAP, given  2. Abnormal fetal ultrasound Intracranial abnormality, unclear diagnosis Normal chromosome and microarray analysis from amniocentesis  Fetal brain MRI ordered yesterday, referral in process Fetal echo 01/19/2024 was unremarkable based on my read of report, final note by Dr. Filbert still pending  3. History of cesarean section- d/t breech presentation Discussed RCS vs TOLAC, prefers TOLAC Discussed risks and benefits, VBAC consent signed  4. History of gestational diabetes Normal A1c at new OB, GTT today  5. History of preterm delivery Iatrogenic at 36 wks in setting of severe pre-e  6. History of severe pre-eclampsia   7. Intrauterine growth restriction affecting antepartum care of mother, single or unspecified fetus US  02/01/2024, EFW 905g <1%, AC 1.2%, AFI wnl, UAD wnl F/w MFM for weekly antenatal testing  8. Chronic hypertension in obstetric context in third trimester Normotensive on Nifed 30 XL  Taking ASA  Preterm labor symptoms and general obstetric precautions including but not limited to vaginal bleeding, contractions, leaking of fluid and fetal movement were reviewed in detail with the patient. Please refer to After Visit Summary for other counseling recommendations.  Return in 2 weeks (on 02/16/2024) for [facility], ob visit, needs MD.   Lola Donnice HERO, MD "

## 2024-02-02 NOTE — Discharge Instructions (Signed)
 For prevention of migraines in pregnancy: -Magnesium , 400mg  by mouth, once daily -Vitamin B2, 400mg  by mouth, once daily  For treatment of migraines in pregnancy: -take medication at the first sign of the pain of a headache, or the first sign of your aura -start with 1000mg  Tylenol  (or excedrin tension headache with acetaminophen  & caffeine  only, NOT aspirin ) -if no relief after 1-2hours, can take Flexeril  10mg  -Do not take more than 4000 mg of tylenol  (acetaminophen ) per day

## 2024-02-03 LAB — GLUCOSE TOLERANCE, 2 HOURS W/ 1HR
Glucose, 1 hour: 94 mg/dL (ref 70–179)
Glucose, 2 hour: 89 mg/dL (ref 70–152)
Glucose, Fasting: 70 mg/dL (ref 70–91)

## 2024-02-03 LAB — COMPREHENSIVE METABOLIC PANEL WITH GFR
ALT: 8 [IU]/L (ref 0–32)
AST: 11 [IU]/L (ref 0–40)
Albumin: 3.6 g/dL — ABNORMAL LOW (ref 4.0–5.0)
Alkaline Phosphatase: 44 [IU]/L (ref 41–116)
BUN/Creatinine Ratio: 8 — ABNORMAL LOW (ref 9–23)
BUN: 4 mg/dL — ABNORMAL LOW (ref 6–20)
Bilirubin Total: 0.2 mg/dL (ref 0.0–1.2)
CO2: 22 mmol/L (ref 20–29)
Calcium: 8.9 mg/dL (ref 8.7–10.2)
Chloride: 103 mmol/L (ref 96–106)
Creatinine, Ser: 0.52 mg/dL — ABNORMAL LOW (ref 0.57–1.00)
Globulin, Total: 2.5 g/dL (ref 1.5–4.5)
Glucose: 96 mg/dL (ref 70–99)
Potassium: 3.8 mmol/L (ref 3.5–5.2)
Sodium: 138 mmol/L (ref 134–144)
Total Protein: 6.1 g/dL (ref 6.0–8.5)
eGFR: 133 mL/min/{1.73_m2}

## 2024-02-03 LAB — CBC
Hematocrit: 36.8 % (ref 34.0–46.6)
Hemoglobin: 12.4 g/dL (ref 11.1–15.9)
MCH: 32 pg (ref 26.6–33.0)
MCHC: 33.7 g/dL (ref 31.5–35.7)
MCV: 95 fL (ref 79–97)
Platelets: 207 10*3/uL (ref 150–450)
RBC: 3.87 x10E6/uL (ref 3.77–5.28)
RDW: 12.7 % (ref 11.7–15.4)
WBC: 6.1 10*3/uL (ref 3.4–10.8)

## 2024-02-03 LAB — HIV ANTIBODY (ROUTINE TESTING W REFLEX): HIV Screen 4th Generation wRfx: NONREACTIVE

## 2024-02-03 LAB — SYPHILIS: RPR W/REFLEX TO RPR TITER AND TREPONEMAL ANTIBODIES, TRADITIONAL SCREENING AND DIAGNOSIS ALGORITHM: RPR Ser Ql: NONREACTIVE

## 2024-02-07 ENCOUNTER — Other Ambulatory Visit: Payer: Self-pay | Admitting: Obstetrics and Gynecology

## 2024-02-07 ENCOUNTER — Ambulatory Visit: Attending: Obstetrics | Admitting: Obstetrics

## 2024-02-07 ENCOUNTER — Other Ambulatory Visit: Payer: Self-pay | Admitting: Obstetrics

## 2024-02-07 ENCOUNTER — Ambulatory Visit

## 2024-02-07 VITALS — BP 152/84 | HR 74

## 2024-02-07 DIAGNOSIS — O10913 Unspecified pre-existing hypertension complicating pregnancy, third trimester: Secondary | ICD-10-CM

## 2024-02-07 DIAGNOSIS — O365990 Maternal care for other known or suspected poor fetal growth, unspecified trimester, not applicable or unspecified: Secondary | ICD-10-CM

## 2024-02-07 DIAGNOSIS — O10919 Unspecified pre-existing hypertension complicating pregnancy, unspecified trimester: Secondary | ICD-10-CM

## 2024-02-07 DIAGNOSIS — O365920 Maternal care for other known or suspected poor fetal growth, second trimester, not applicable or unspecified: Secondary | ICD-10-CM | POA: Diagnosis not present

## 2024-02-07 DIAGNOSIS — O365930 Maternal care for other known or suspected poor fetal growth, third trimester, not applicable or unspecified: Secondary | ICD-10-CM | POA: Insufficient documentation

## 2024-02-07 DIAGNOSIS — Z3A30 30 weeks gestation of pregnancy: Secondary | ICD-10-CM | POA: Diagnosis not present

## 2024-02-07 DIAGNOSIS — O358XX0 Maternal care for other (suspected) fetal abnormality and damage, not applicable or unspecified: Secondary | ICD-10-CM | POA: Diagnosis not present

## 2024-02-07 DIAGNOSIS — O10012 Pre-existing essential hypertension complicating pregnancy, second trimester: Secondary | ICD-10-CM | POA: Diagnosis not present

## 2024-02-07 DIAGNOSIS — O34219 Maternal care for unspecified type scar from previous cesarean delivery: Secondary | ICD-10-CM | POA: Insufficient documentation

## 2024-02-07 DIAGNOSIS — O10013 Pre-existing essential hypertension complicating pregnancy, third trimester: Secondary | ICD-10-CM | POA: Insufficient documentation

## 2024-02-07 DIAGNOSIS — O359XX0 Maternal care for (suspected) fetal abnormality and damage, unspecified, not applicable or unspecified: Secondary | ICD-10-CM

## 2024-02-07 DIAGNOSIS — Q031 Atresia of foramina of Magendie and Luschka: Secondary | ICD-10-CM

## 2024-02-07 NOTE — Progress Notes (Signed)
 MFM Consult Note  Regina [name] is currently at [redacted]w[redacted]d. She has been followed due to a fetus with a significant brain abnormality, IUGR, and chronic hypertension treated with nifedipine  30 mg daily.  She continues to complain of a mild headache for the past 2 weeks.  Her blood pressures today were 152/84 and 158/88.  She was evaluated in the [facility] 5 days ago and ruled out for preeclampsia with a negative P/C ratio and normal PIH labs.  She had an amniocentesis performed earlier in her pregnancy which showed normal 46,XX chromosomes.  The amniotic fluid AFP level was also within normal limits.  She reports feeling fetal movements throughout the day.  Sonographic findings Single intrauterine pregnancy at 30w 0d. Fetal cardiac activity:  Observed and appears normal. Presentation: Breech. Amniotic fluid: Within normal limits.  MVP: 4.74 cm.  AFI: 13.99 cm. Placenta: Anterior. BPP: 8/8.    The significant fetal brain abnormality along with ventriculomegaly continues to be noted today.  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  Doppler studies of the umbilical arteries performed today shows an elevated S/D ratio of 4.45.  There were no signs of absent or reversed end-diastolic flow.  Fetal brain abnormality, micrognathia, and IUGR  She is scheduled for a fetal MRI to assess the fetal brain at [facility] on February 16, 2024.  She was advised that due to the fetal brain abnormality, IUGR, and micrognathia noted on her prenatal ultrasound exams, there is a possibility that her fetus has a genetic syndrome that will not be diagnosed until after birth.  The increased risk of a fetal demise due to IUGR and the fetal abnormalities noted today was discussed. She was advised to continue monitoring fetal movements on a daily basis.   We will present her case at a future [facility] meeting to inform the pediatric team regarding her prenatal ultrasound findings and to  formulate a management plan for her baby after birth.  Due to IUGR, we will continue to follow her with weekly fetal testing and umbilical artery Doppler studies until delivery.    The patient understands that the goal for her delivery will be at around 37 weeks.    However should her umbilical artery Doppler studies show absent or reversed end-diastolic flow, delivery may be recommended at around 34 weeks.  She was advised to continue taking nifedipine  for blood pressure control.    Preeclampsia precautions were reviewed today.    She will return in 1 week for another BPP and umbilical artery Doppler study.  The patient stated that all of her questions were answered.   A total of 30 minutes was spent counseling and coordinating the care for this patient.  Greater than 50% of the time was spent in direct face-to-face contact.

## 2024-02-14 ENCOUNTER — Ambulatory Visit

## 2024-02-14 ENCOUNTER — Other Ambulatory Visit

## 2024-02-19 ENCOUNTER — Encounter: Payer: Self-pay | Admitting: Obstetrics and Gynecology

## 2024-02-22 ENCOUNTER — Ambulatory Visit

## 2024-03-06 ENCOUNTER — Encounter: Payer: Self-pay | Admitting: Obstetrics and Gynecology

## 2024-03-20 ENCOUNTER — Encounter: Payer: Self-pay | Admitting: Family Medicine
